# Patient Record
Sex: Female | Born: 1975
Health system: Southern US, Community
[De-identification: ages and names within clinical notes are randomized; demographics above are authoritative.]

## PROBLEM LIST (undated history)

## (undated) DIAGNOSIS — N926 Irregular menstruation, unspecified: Secondary | ICD-10-CM

## (undated) DIAGNOSIS — E669 Obesity, unspecified: Secondary | ICD-10-CM

## (undated) DIAGNOSIS — R87612 Low grade squamous intraepithelial lesion on cytologic smear of cervix (LGSIL): Secondary | ICD-10-CM

## (undated) DIAGNOSIS — R87810 Cervical high risk human papillomavirus (HPV) DNA test positive: Secondary | ICD-10-CM

## (undated) DIAGNOSIS — A6000 Herpesviral infection of urogenital system, unspecified: Secondary | ICD-10-CM

## (undated) HISTORY — PX: TONSILLECTOMY: SUR1361

## (undated) HISTORY — DX: Herpesviral infection of urogenital system, unspecified: A60.00

## (undated) HISTORY — DX: Low grade squamous intraepithelial lesion on cytologic smear of cervix (LGSIL): R87.612

## (undated) HISTORY — DX: Cervical high risk human papillomavirus (HPV) DNA test positive: R87.810

## (undated) HISTORY — DX: Irregular menstruation, unspecified: N92.6

## (undated) HISTORY — DX: Obesity, unspecified: E66.9

---

## 1999-03-29 ENCOUNTER — Other Ambulatory Visit: Admission: RE | Admit: 1999-03-29 | Discharge: 1999-03-29 | Payer: Self-pay | Admitting: Family Medicine

## 1999-08-27 ENCOUNTER — Other Ambulatory Visit: Admission: RE | Admit: 1999-08-27 | Discharge: 1999-08-27 | Payer: Self-pay | Admitting: *Deleted

## 1999-08-27 ENCOUNTER — Encounter (INDEPENDENT_AMBULATORY_CARE_PROVIDER_SITE_OTHER): Payer: Self-pay

## 2002-01-03 ENCOUNTER — Ambulatory Visit (HOSPITAL_COMMUNITY): Admission: RE | Admit: 2002-01-03 | Discharge: 2002-01-03 | Payer: Self-pay | Admitting: Neurology

## 2002-02-12 ENCOUNTER — Ambulatory Visit (HOSPITAL_COMMUNITY): Admission: RE | Admit: 2002-02-12 | Discharge: 2002-02-12 | Payer: Self-pay | Admitting: Neurology

## 2002-11-19 ENCOUNTER — Inpatient Hospital Stay (HOSPITAL_COMMUNITY): Admission: AD | Admit: 2002-11-19 | Discharge: 2002-12-07 | Payer: Self-pay | Admitting: Obstetrics and Gynecology

## 2002-12-05 ENCOUNTER — Encounter (INDEPENDENT_AMBULATORY_CARE_PROVIDER_SITE_OTHER): Payer: Self-pay | Admitting: Specialist

## 2002-12-08 ENCOUNTER — Encounter: Admission: RE | Admit: 2002-12-08 | Discharge: 2003-01-07 | Payer: Self-pay | Admitting: Obstetrics and Gynecology

## 2003-02-07 ENCOUNTER — Encounter: Admission: RE | Admit: 2003-02-07 | Discharge: 2003-03-09 | Payer: Self-pay | Admitting: Obstetrics and Gynecology

## 2003-03-10 ENCOUNTER — Encounter: Admission: RE | Admit: 2003-03-10 | Discharge: 2003-04-09 | Payer: Self-pay | Admitting: Obstetrics and Gynecology

## 2003-05-08 ENCOUNTER — Encounter: Admission: RE | Admit: 2003-05-08 | Discharge: 2003-06-07 | Payer: Self-pay | Admitting: Obstetrics and Gynecology

## 2004-08-27 IMAGING — US US OB TRANSVAGINAL MODIFY
1 series · 13 of 28 positions shown · non-contrast
Comparison: none

CLINICAL DATA: 33 week 3 day assigned gestational age.  Premature rupture of membranes.  Follow-up amniotic fluid volume and evaluate fetal growth.

[Series 1: unknown · 0.21mm/px · 13 of 42 slices shown]
[im 2/42]
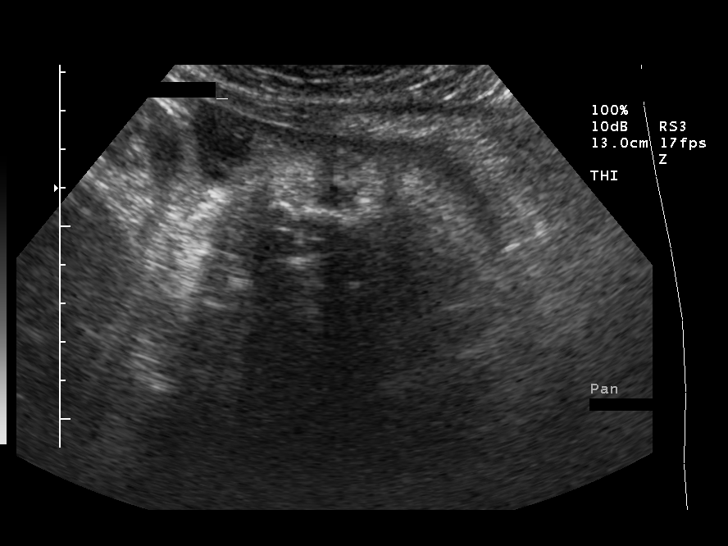
[im 5/42]
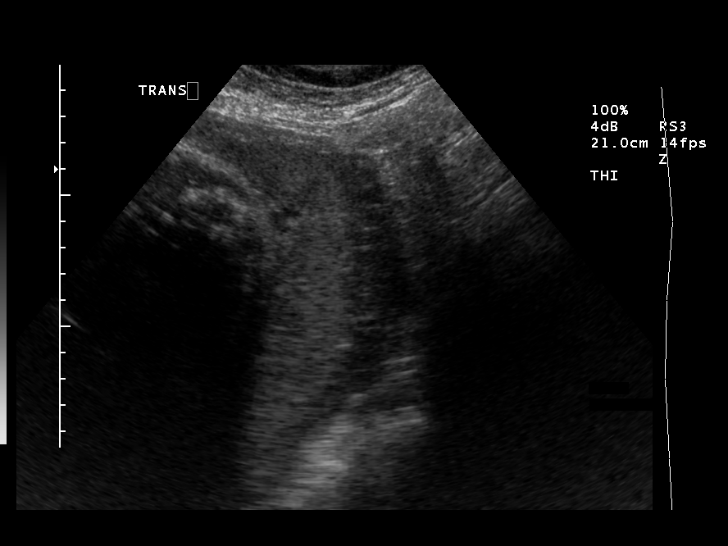
[im 8/42]
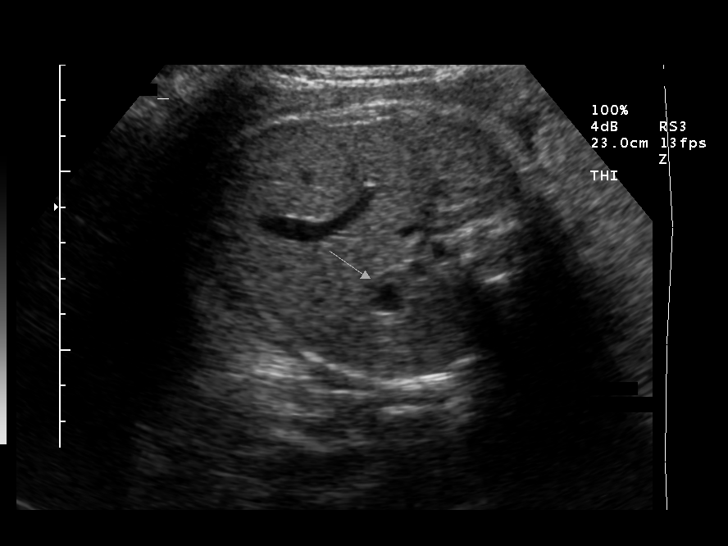
[im 11/42]
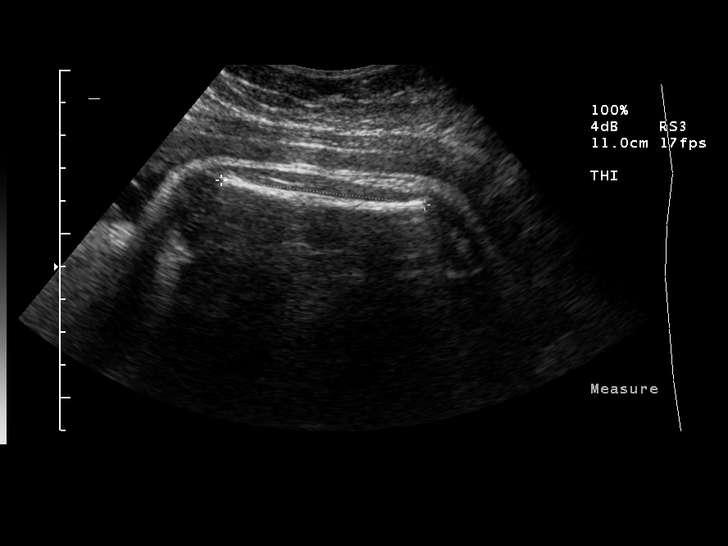
[im 14/42]
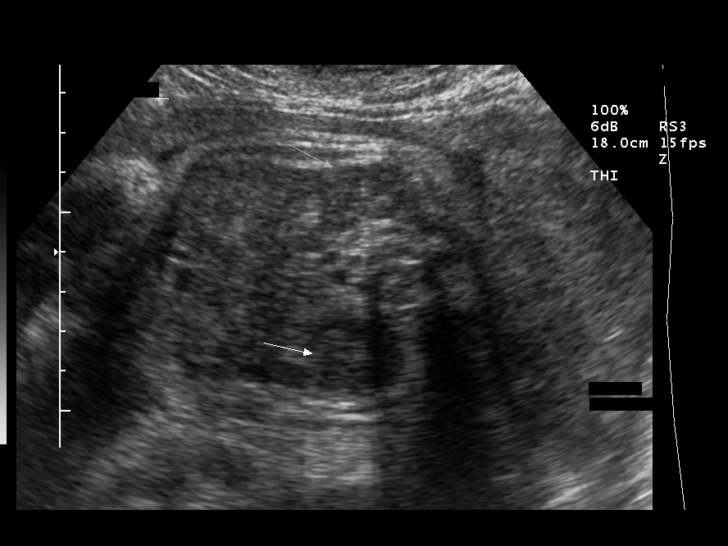
[im 17/42]
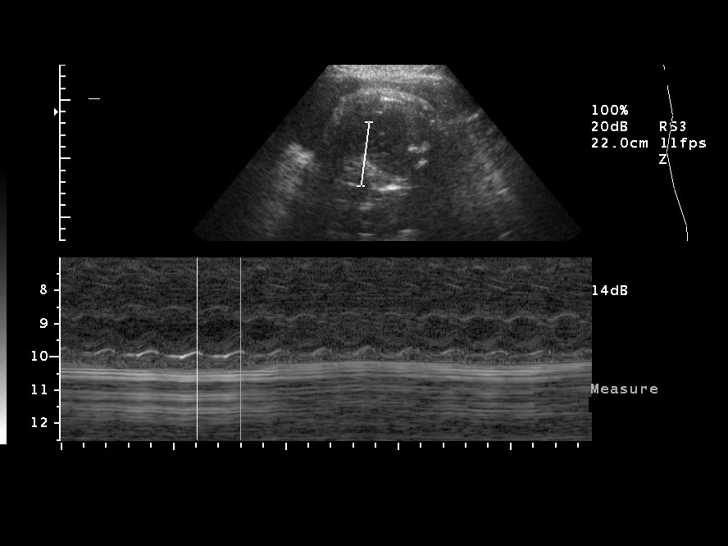
[im 22/42]
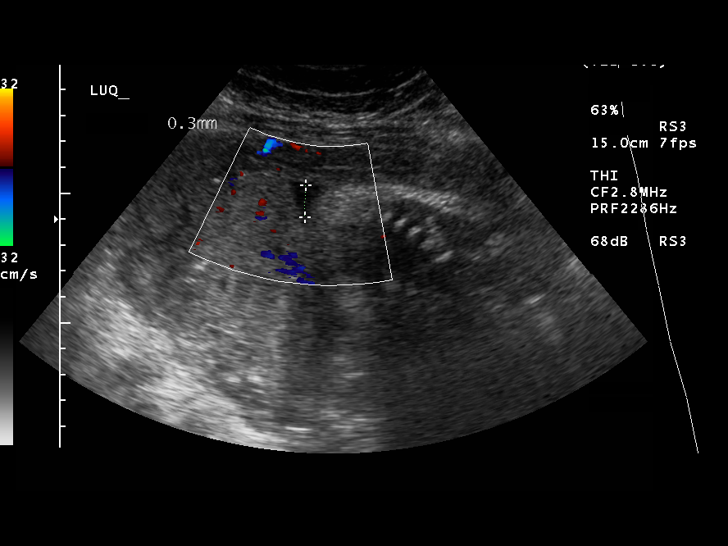
[im 25/42]
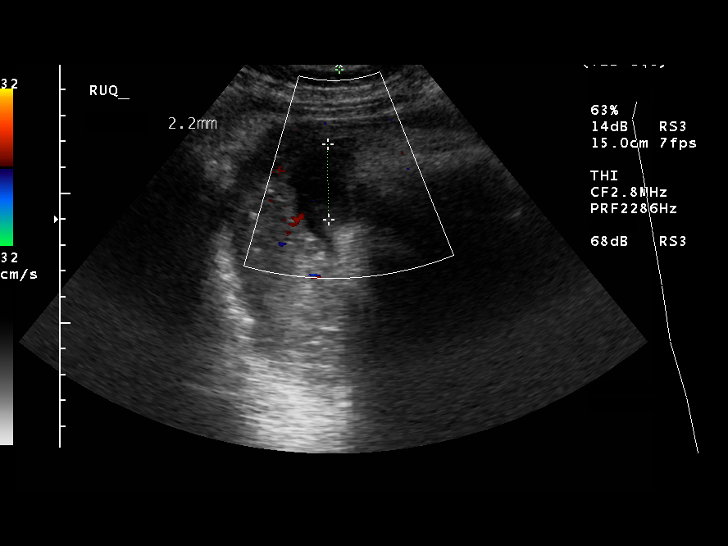
[im 28/42]
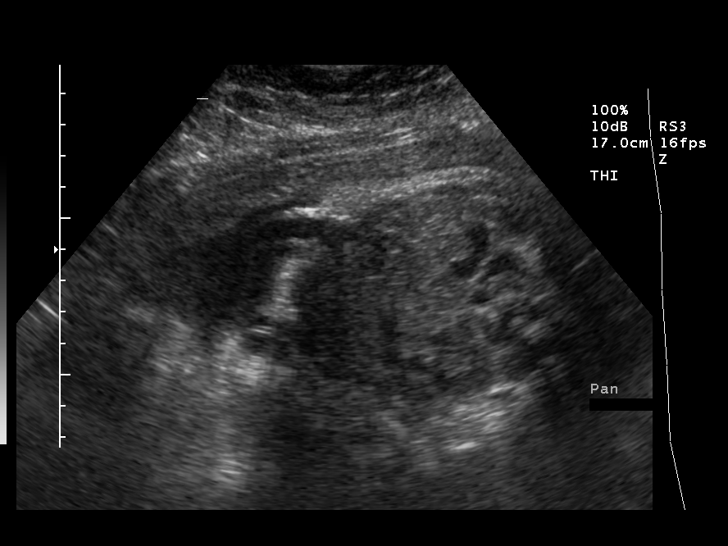
[im 31/42]
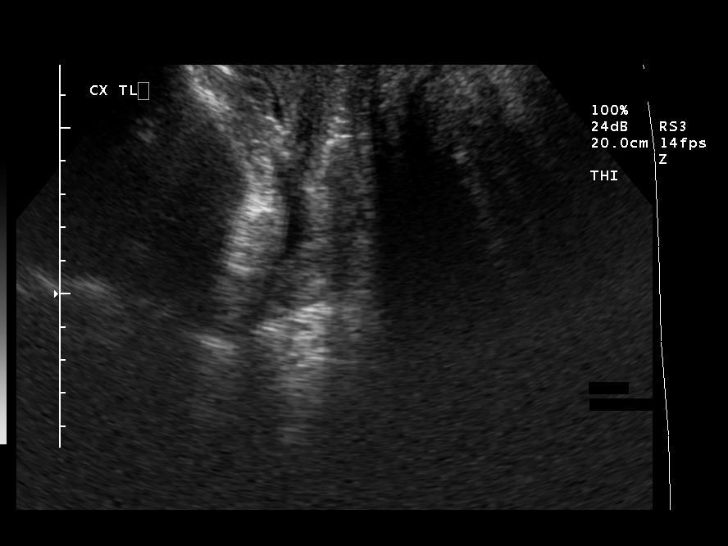
[im 34/42]
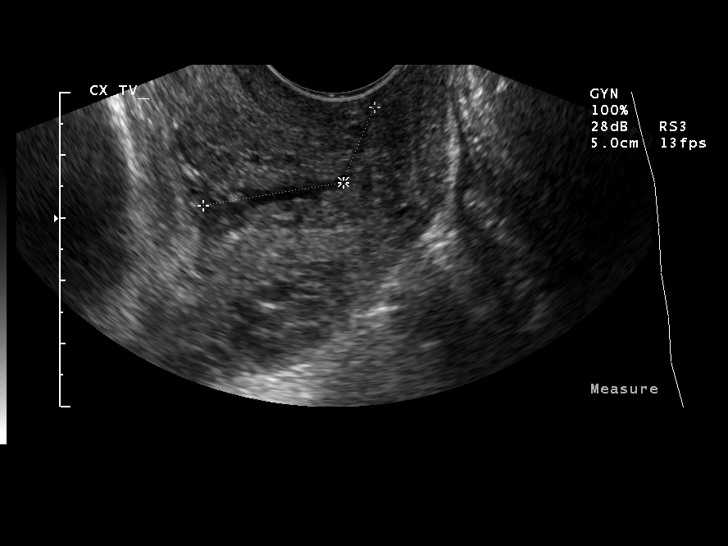
[im 37/42]
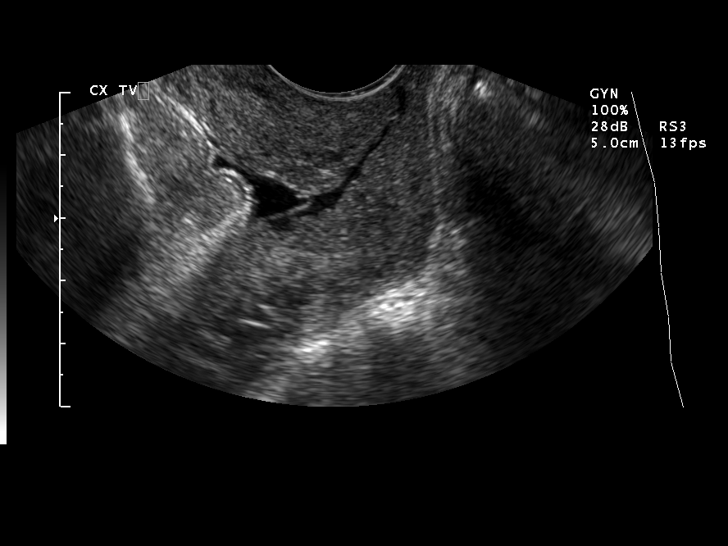
[im 40/42]
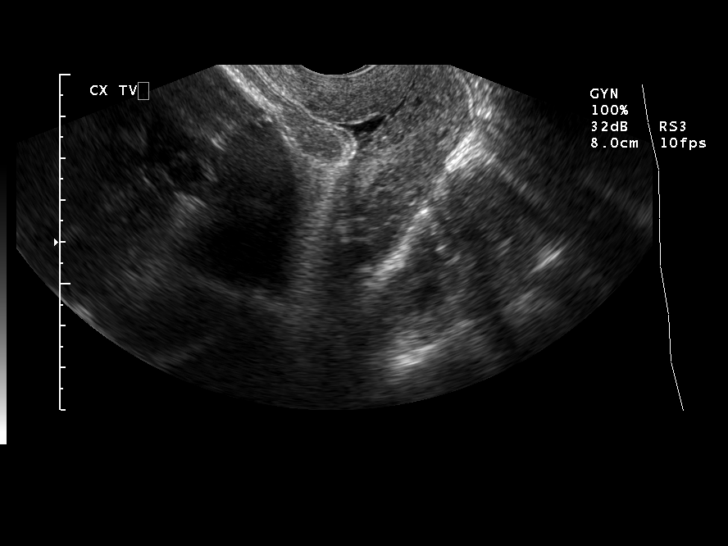

[13 of 28 positions shown; findings below may reference images not displayed]

OBSTETRICAL ULTRASOUND RE-EVALUATION WITH TRANSVAGINAL

NUMBER OF FETUSES:  1
HEART RATE:  155
MOVEMENT:  Yes
BREATHING:  Yes
PRESENTATION:  Cephalic
PLACENTAL LOCATION:  Left lateral
GRADE:  I
PREVIA:  No
AMNIOTIC FLUID (subjective): Decreased 
AMNIOTIC FLUID (objective): 5.8 cm AFI (5th - 95th%ile =  8.3?24.5 cm for 33w)

FETAL BIOMETRY
BPD:  8.2 cm   32 w 6 d
HC:  29.8  cm  33 w 0 d
AC:  30.3 cm  34 w 2 d
FL:  6.3 cm  32 w 3 d
MEAN GA:  33 w 1 d
ASSIGNED GA:  33 w 3 d (LMP)
EFW:  2577 g (H)  75th ? 90th%ile ( 6639 ? 3935 g) For 33 weeks

FETAL ANATOMY
LATERAL VENTRICLES:    Visualized 
THALAMI/CSP:  Previously seen 
POSTERIOR FOSSA:  Not visualized 
NUCHAL REGION:  N/A
SPINE:  Previously seen 
4 CHAMBER HEART ON LEFT:  Visualized 
STOMACH ON LEFT:  Visualized 
3 VESSEL CORD:  Visualized 
CORD INSERTION SITE:  Visualized 
KIDNEYS:  Visualized 
BLADDER:  Visualized 
EXTREMITIES:  Not visualized 

MATERNAL FINDINGS
CERVIX:  2.1 cm Transvaginal; dynamic funneling of the internal os is noted.
IMPRESSION: Assigned gestational age is currently 33 weeks 3 days.  Appropriate fetal growth, with EFW slightly above 75th percentile.  
Decreased amniotic fluid volume, with AFI of 5.8 cm.  This is consistent with patient?s history of ruptured membranes.
Shortened cervix measuring 2.1 cm, with dynamic funneling of the internal os noted.

## 2006-10-25 ENCOUNTER — Emergency Department (HOSPITAL_COMMUNITY): Admission: EM | Admit: 2006-10-25 | Discharge: 2006-10-26 | Payer: Self-pay | Admitting: Emergency Medicine

## 2007-04-06 ENCOUNTER — Emergency Department (HOSPITAL_COMMUNITY): Admission: EM | Admit: 2007-04-06 | Discharge: 2007-04-07 | Payer: Self-pay | Admitting: Emergency Medicine

## 2008-12-20 ENCOUNTER — Emergency Department (HOSPITAL_COMMUNITY): Admission: EM | Admit: 2008-12-20 | Discharge: 2008-12-20 | Payer: Self-pay | Admitting: Family Medicine

## 2010-01-14 HISTORY — PX: COLPOSCOPY: SHX161

## 2010-06-01 NOTE — H&P (Signed)
NAMETANYSHA, QUANT NO.:  [id number]   MEDICAL RECORD NO.:  [id number]                   PATIENT TYPE:  INP   LOCATION:  9157                                 FACILITY:  [facility]   PHYSICIAN:  Janine Limbo, M.D.            DATE OF BIRTH:  08/21/1975   DATE OF ADMISSION:  11/19/2002  DATE OF DISCHARGE:                                HISTORY & PHYSICAL   Ms. Mruk is a 35 year old married, black female, gravida 3, para 0-0-2-  0, at 31-2/7 weeks who presents to the office with leaking clear fluid all  day.  She denies bleeding.  She denies uterine contractions.  She reports  positive fetal movement.  Her pregnancy has been followed by the [facility]  Certified [facility] Midwife [facility] and has been remarkable  for:  1.  Two TABs.  2.  History of hypertension.  3.  History of occasional  migraines.  4.  Increased body mass index.   PRENATAL LABS:  Were collected on 06/25/2002.  Hemoglobin 11.8, hematocrit  36.2, platelets 350,000.  Blood type A positive.  Antibody negative.  Sickle  cell trait negative.  RPR nonreactive.  Rubella immune.  Hepatitis B surface  antigen negative.  A Pap smear, from July 2003, was within normal limits.  Her one hour Glucola was 67 and her RPR at that time was nonreactive.  Her  Pap smear from, August 18, 2002, was within normal limits.   HISTORY OF PRESENT PREGNANCY:  She presented for care at [facility],  on July 25, 2002, at 10-weeks gestation.  Pregnancy ultrasonography at 18-  weeks gestation shows growth consistent with previous dating.  Pregnancy  ultrasonography at 22-weeks gestation was able to visualize the complete  anatomy.  The rest of her prenatal care has been unremarkable.   OBSTETRICAL HISTORY:  She is gravida 3, para 0-0-2-0.  In 1995 and in 1998,  at about six weeks gestation, she had TABs with no complications.   MEDICAL HISTORY:  1. She has an allergy to AMOXICILLIN that results  in hives.  2. She has used Depo-Provera in the past as well as Ortho Tri-Cyclen for     contraception.  3. She has had on yeast infection.  4. She reports having had the usual childhood illnesses.  5. She was diagnosed with hypertension in 2002.  She was treated with     medications for six months and then taken off all medications.  She has     several family members with hypertension.  6. She was diagnosed in 2000 with migraines by Dr. Adonis Housekeeper and they are     only occasional now.   SURGICAL HISTORY:  1. Remarkable for tonsillectomy in 2001.  2. Lumbar punctures in 2004 for a visual disturbances.   FAMILY MEDICAL HISTORY:  Multiple family members with hypertension.  Mother  with varicosities.  Multiple family members with diabetes.  GENETIC HISTORY:  Unremarkable.   SOCIAL HISTORY:  The patient is married to the father of the baby, whose  name is Rayna Sexton.  He is involved and supportive.  They deny any alcohol,  tobacco or illicit drug use with the pregnancy.   OBJECTIVE DATA:  VITAL SIGNS:  Stable.  She is afebrile.  HEENT:  Grossly within normal limits.  CHEST:  Clear to auscultation.  HEART:  Regular rate and rhythm.  ABDOMEN:  Gravid in contour with fundal height extending approximately 31-cm  above the pubic symphysis.  Fetal heart rate is found by doppler to be in  the 150s.  There are no uterine contractions, per patient report.  PELVIC:  Sterile speculum exam shows questionable pooling, positive  nitrazine and positive ferning.  Digital cervical exam was not performed.  EXTREMITIES:  Within normal limits.   ASSESSMENT:  1. Intrauterine pregnancy at 31-2/7 weeks.  2. Preterm premature rupture of membranes.   PLAN:  Evaluate at [facility] and with a possible need to transfer the  patient to an alternate location that has a NICU available.     Cam Hai, C.N.M.                     Janine Limbo, M.D.    KS/MEDQ  D:  11/19/2002  T:  11/19/2002  Job:   [id number]

## 2010-06-01 NOTE — Discharge Summary (Signed)
NAMEWRENLEY, Carmen Sosa                        ACCOUNT NO.:  [id number]   MEDICAL RECORD NO.:  [id number]                   PATIENT TYPE:  INP   LOCATION:  9143                                 FACILITY:  [facility]   PHYSICIAN:  Osborn Coho, M.D.                DATE OF BIRTH:  15-Mar-1975   DATE OF ADMISSION:  11/19/2002  DATE OF DISCHARGE:  12/07/2002                                 DISCHARGE SUMMARY   ADMISSION DIAGNOSES:  1. Intrauterine pregnancy at [redacted] weeks gestation.  2. Spontaneous premature rupture of the membranes.   DISCHARGE DIAGNOSES:  1. Intrauterine pregnancy at [redacted] weeks gestation.  2. Spontaneous premature rupture of the membranes.  3. Status post normal spontaneous vaginal delivery of a viable female infant.  4. Breast-feeding.  5. Infant in the NICU.   PROCEDURE:  Normal spontaneous vaginal delivery of a viable female infant on  December 05, 2002 whose name is Rayna Sexton.  Attended in delivery by Osborn Coho, M.D.  Apgars were 8 and 9 and the baby weighed 5 pounds 6 ounces.   HOSPITAL COURSE:  Carmen Sosa is a 35 year old married black female  gravida 3, para 0-0-2-0 at [redacted] weeks gestation admitted with premature  rupture of the membranes with clear fluid.  She was not contracting actively  on admission and was admitted primarily for observation and received  betamethasone series upon admission.  Her group B Strep, gonorrhea, and  Chlamydia cultures were all negative on admission.  She was observed or  followed expectantly over the next several days and did not start laboring  until the evening of November 20 when she started contracting every three to  five minutes.  She was fully dilated about 4 a.m. on December 05, 2002 and  started pushing and delivered a viable female infant named Rayna Sexton at 4:37 a.m.  who weighed 5 pounds 6 ounces and had Apgars of 8 and 9 attended in delivery  by Osborn Coho, M.D.  Please see operative note for details.  She was  presumptively  diagnosed with chorioamnionitis prior to delivery and started  on clindamycin for that diagnosis.  The infant is doing well in the NICU,  though currently on IV antibiotics and is breathing room air and is deemed  stable.  The patient is ambulating, voiding, and eating without difficulty.  Her vital signs are stable and she has been afebrile throughout her hospital  stay.  She plans to breast-feed and is currently pumping without difficulty.  She is not decided regarding contraception at this time.  She is deemed  ready for discharge today.   DISCHARGE INSTRUCTIONS:  As per the [facility] handout.   DISCHARGE MEDICATIONS:  1. Motrin 600 mg p.o. q.6h. p.r.n. for pain.  2. Prenatal vitamins daily.   DISCHARGE LABORATORIES:  Hemoglobin 10.8, WBC count 12.1, platelets 303,000.   DISCHARGE FOLLOWUP:  Six weeks at [facility] or p.r.n.  Concha Pyo. [place], C.N.M.              Osborn Coho, M.D.    SJD/MEDQ  D:  12/07/2002  T:  12/07/2002  Job:  [id number]

## 2010-10-08 LAB — DIFFERENTIAL
Basophils Relative: 1
Eosinophils Relative: 1
Lymphocytes Relative: 39
Neutro Abs: 4.8
Neutrophils Relative %: 53

## 2010-10-08 LAB — POCT I-STAT, CHEM 8
BUN: 11
Calcium, Ion: 1.2
Chloride: 109
Creatinine, Ser: 1
Glucose, Bld: 118 — ABNORMAL HIGH
TCO2: 21

## 2010-10-08 LAB — CBC
HCT: 37.8
MCV: 86.8
WBC: 9.1

## 2010-10-08 LAB — POCT CARDIAC MARKERS: Myoglobin, poc: 33.3

## 2010-10-25 LAB — DIFFERENTIAL
Basophils Absolute: 0.1
Basophils Relative: 1
Eosinophils Absolute: 0.1
Eosinophils Relative: 1
Lymphs Abs: 3.8 — ABNORMAL HIGH
Monocytes Absolute: 1.1 — ABNORMAL HIGH
Monocytes Relative: 10
Neutrophils Relative %: 53

## 2010-10-25 LAB — POCT I-STAT CREATININE
Creatinine, Ser: 0.8
Operator id: 133351

## 2010-10-25 LAB — POCT CARDIAC MARKERS
CKMB, poc: <1 — ABNORMAL LOW
CKMB, poc: <1 — ABNORMAL LOW
Myoglobin, poc: 60.2
Operator id: 133351
Operator id: 133351
Troponin i, poc: <0.05

## 2010-10-25 LAB — CBC
Platelets: 397
RDW: 14.4 — ABNORMAL HIGH
WBC: 10.7 — ABNORMAL HIGH

## 2010-10-25 LAB — I-STAT 8, (EC8 V) (CONVERTED LAB)
BUN: 9
Chloride: 105

## 2010-10-25 LAB — D-DIMER, QUANTITATIVE: D-Dimer, Quant: 0.33

## 2014-01-28 ENCOUNTER — Encounter (HOSPITAL_COMMUNITY): Payer: Self-pay | Admitting: Emergency Medicine

## 2014-01-28 ENCOUNTER — Emergency Department (HOSPITAL_COMMUNITY): Payer: 59

## 2014-01-28 ENCOUNTER — Emergency Department (HOSPITAL_COMMUNITY)
Admission: EM | Admit: 2014-01-28 | Discharge: 2014-01-28 | Disposition: A | Discharge status: Other Acute Inpatient Hospital | Payer: 59 | Source: Home / Self Care | Attending: Emergency Medicine | Admitting: Emergency Medicine

## 2014-01-28 ENCOUNTER — Other Ambulatory Visit: Payer: Self-pay

## 2014-01-28 ENCOUNTER — Emergency Department (HOSPITAL_COMMUNITY)
Admission: EM | Admit: 2014-01-28 | Discharge: 2014-01-29 | Disposition: A | Discharge status: Home / Self Care | Payer: 59 | Attending: Emergency Medicine | Admitting: Emergency Medicine

## 2014-01-28 ENCOUNTER — Encounter (HOSPITAL_COMMUNITY): Payer: Self-pay

## 2014-01-28 DIAGNOSIS — Z880 Allergy status to penicillin: Secondary | ICD-10-CM | POA: Insufficient documentation

## 2014-01-28 DIAGNOSIS — R079 Chest pain, unspecified: Secondary | ICD-10-CM

## 2014-01-28 DIAGNOSIS — I10 Essential (primary) hypertension: Secondary | ICD-10-CM | POA: Insufficient documentation

## 2014-01-28 DIAGNOSIS — R0789 Other chest pain: Secondary | ICD-10-CM | POA: Diagnosis not present

## 2014-01-28 DIAGNOSIS — Z79899 Other long term (current) drug therapy: Secondary | ICD-10-CM | POA: Insufficient documentation

## 2014-01-28 LAB — I-STAT CHEM 8, ED
BUN: 6 mg/dL (ref 6–23)
CALCIUM ION: 1.19 mmol/L (ref 1.12–1.23)
CHLORIDE: 103 meq/L (ref 96–112)
CREATININE: 0.6 mg/dL (ref 0.50–1.10)
Glucose, Bld: 95 mg/dL (ref 70–99)
HCT: 40 % (ref 36.0–46.0)
HEMOGLOBIN: 13.6 g/dL (ref 12.0–15.0)
Potassium: 4.1 mmol/L (ref 3.5–5.1)
Sodium: 137 mmol/L (ref 135–145)
TCO2: 21 mmol/L (ref 0–100)

## 2014-01-28 LAB — I-STAT TROPONIN, ED: Troponin i, poc: 0 ng/mL (ref 0.00–0.08)

## 2014-01-28 MED ORDER — NITROGLYCERIN 0.4 MG SL SUBL
SUBLINGUAL_TABLET | SUBLINGUAL | Status: AC
  Filled 2014-01-28: qty 1

## 2014-01-28 MED ORDER — IBUPROFEN 200 MG PO TABS
600.0000 mg | ORAL_TABLET | Freq: Once | ORAL | Status: AC
  Administered 2014-01-28: 600 mg via ORAL
  Filled 2014-01-28 (×2): qty 1

## 2014-01-28 MED ORDER — ETODOLAC 400 MG PO TABS: 400.0000 mg | ORAL_TABLET | Freq: Two times a day (BID) | ORAL | Status: AC

## 2014-01-28 MED ORDER — SODIUM CHLORIDE 0.9 % IV SOLN
INTRAVENOUS | Status: DC
  Administered 2014-01-28: 20:00:00 via INTRAVENOUS

## 2014-01-28 MED ORDER — ASPIRIN 81 MG PO CHEW
324.0000 mg | CHEWABLE_TABLET | Freq: Once | ORAL | Status: AC
  Administered 2014-01-28: 324 mg via ORAL

## 2014-01-28 MED ORDER — NITROGLYCERIN 0.4 MG SL SUBL
0.4000 mg | SUBLINGUAL_TABLET | SUBLINGUAL | Status: AC | PRN
  Administered 2014-01-28: 0.4 mg via SUBLINGUAL

## 2014-01-28 MED ORDER — ASPIRIN 81 MG PO CHEW
CHEWABLE_TABLET | ORAL | Status: AC
  Filled 2014-01-28: qty 4

## 2014-01-28 MED ORDER — IBUPROFEN 400 MG PO TABS: 400.0000 mg | ORAL_TABLET | Freq: Once | ORAL | Status: DC

## 2014-01-28 MED ORDER — HYDROCODONE-ACETAMINOPHEN 5-325 MG PO TABS
1.0000 | ORAL_TABLET | Freq: Once | ORAL | Status: AC
  Administered 2014-01-28: 1 via ORAL
  Filled 2014-01-28: qty 1

## 2014-01-28 MED ORDER — CYCLOBENZAPRINE HCL 10 MG PO TABS
10.0000 mg | ORAL_TABLET | Freq: Once | ORAL | Status: AC
  Administered 2014-01-28: 10 mg via ORAL
  Filled 2014-01-28: qty 1

## 2014-01-28 MED ORDER — CYCLOBENZAPRINE HCL 10 MG PO TABS
10.0000 mg | ORAL_TABLET | Freq: Once | ORAL | Status: DC
Start: 2014-01-28 — End: 2016-08-09

## 2014-01-28 MED ORDER — HYDROCODONE-ACETAMINOPHEN 5-325 MG PO TABS: 2.0000 | ORAL_TABLET | ORAL | Status: DC | PRN

## 2014-01-28 NOTE — ED Notes (Signed)
Ambulated to bathroom at this time.  Md notified of continued c/o pain.

## 2014-01-28 NOTE — ED Notes (Signed)
Per EMS: Patient from UC where pt c/o Chest pain that started Wednesday and has not resolved.  Pain has been constant since.  Pt has experienced intermittent pain to left jaw, and pain shoots down left arm to finger tips.  Pt rates pain 6/10.  Pt described the pain as a "tightness".  EMS EKG WDL, vitals stable.

## 2014-01-28 NOTE — ED Provider Notes (Signed)
CSN: [id number]     Arrival date & time 01/28/14  2051 History   First MD Initiated Contact with Patient 01/28/14 2107     Chief Complaint  Patient presents with  . Chest Pain     (Consider location/radiation/quality/duration/timing/severity/associated sxs/prior Treatment) Patient is a 39 y.o. female presenting with chest pain. The history is provided by the patient and a relative. No language interpreter was used.  Chest Pain Pain location:  L chest Pain quality: pressure   Pain radiates to:  Does not radiate Pain radiates to the back: no   Pain severity:  Moderate Onset quality:  Gradual Duration:  2 days Timing:  Constant Progression:  Unchanged Chronicity:  New Context: breathing, movement and raising an arm   Relieved by:  Nothing Worsened by:  Deep breathing, movement and certain positions (Raising left arm) Ineffective treatments:  None tried Associated symptoms: no abdominal pain, no altered mental status, no anxiety, no back pain, no cough, no dizziness, no fever, no lower extremity edema, no nausea, no numbness, no shortness of breath and no weakness   Risk factors: hypertension and obesity   Risk factors: no aortic disease, no birth control, no coronary artery disease, no high cholesterol and not female     Past Medical History  Diagnosis Date  . Hypertension    Past Surgical History  Procedure Laterality Date  . Tonsillectomy     History reviewed. No pertinent family history. History  Substance Use Topics  . Smoking status: Never Smoker   . Smokeless tobacco: Not on file  . Alcohol Use: Yes     Comment: occasioanlly   OB History    No data available     Review of Systems  Constitutional: Negative for fever.  Respiratory: Negative for cough and shortness of breath.   Cardiovascular: Positive for chest pain.  Gastrointestinal: Negative for nausea and abdominal pain.  Genitourinary: Negative for dysuria, urgency and frequency.  Musculoskeletal: Negative  for back pain.  Neurological: Negative for dizziness, weakness and numbness.  All other systems reviewed and are negative.     Allergies  Amoxicillin  Home Medications   Prior to Admission medications   Medication Sig Start Date End Date Taking? Authorizing Provider  cyclobenzaprine (FLEXERIL) 10 MG tablet Take 1 tablet (10 mg total) by mouth once. 01/28/14   Katheren Shams, MD  etodolac (LODINE) 400 MG tablet Take 1 tablet (400 mg total) by mouth 2 (two) times daily. 01/28/14 02/04/14  Katheren Shams, MD  hydrochlorothiazide (HYDRODIURIL) 25 MG tablet Take 25 mg by mouth daily.    Historical Provider, MD  HYDROcodone-acetaminophen (NORCO/VICODIN) 5-325 MG per tablet Take 2 tablets by mouth every 4 (four) hours as needed. 01/28/14   Katheren Shams, MD   BP 135/72 mmHg  Pulse 64  Temp(Src) 97.9 F (36.6 C) (Oral)  Resp 18  Ht 5\' 4"  (1.626 m)  Wt 240 lb (108.863 kg)  BMI 41.18 kg/m2  SpO2 100%  LMP 01/24/2014 Physical Exam  Constitutional: She is oriented to person, place, and time. She appears well-developed and well-nourished. No distress.  HENT:  Head: Normocephalic and atraumatic.  Eyes: Pupils are equal, round, and reactive to light.  Neck: Normal range of motion.  Cardiovascular: Normal rate, regular rhythm, normal heart sounds and intact distal pulses.   Pulmonary/Chest: Effort normal. No respiratory distress. She has no wheezes. She exhibits tenderness (left anterior chest). She exhibits no bony tenderness, no edema and no deformity.  Abdominal: Soft. Bowel sounds are normal. She  exhibits no distension. There is no tenderness. There is no rebound and no guarding.  Neurological: She is alert and oriented to person, place, and time. She has normal strength. No cranial nerve deficit or sensory deficit. She exhibits normal muscle tone. Coordination and gait normal.  Skin: Skin is warm and dry.  Nursing note and vitals reviewed.   ED Course  Procedures (including critical care  time) [place], ED  I-STAT CHEM 8, ED  [facility], ED    Imaging Review Dg Chest 2 View  01/28/2014   CLINICAL DATA:  Three day history of chest pain radiating into the left arm. Current history of hypertension.  EXAM: CHEST  2 VIEW  COMPARISON:  04/06/2007.  CTA chest 10/26/2006.  FINDINGS: Cardiomediastinal silhouette unremarkable, unchanged. Lungs clear. Bronchovascular markings normal. Pulmonary vascularity normal. No visible pleural effusions. No pneumothorax. Mild degenerative changes involving the thoracic spine. No significant interval change.  IMPRESSION: No acute cardiopulmonary disease.  Stable examination.   Electronically Signed   By: Evangeline Dakin M.D.   On: 01/28/2014 23:13     EKG Interpretation   Date/Time:  Friday January 28 2014 20:54:37 EST Ventricular Rate:  65 PR Interval:  213 QRS Duration: 103 QT Interval:  397 QTC Calculation: 413 R Axis:   8 Text Interpretation:  Sinus rhythm Prolonged PR interval No significant  change since last tracing Confirmed by Maryan Rued  MD, Loree Fee ([id number]) on  01/28/2014 8:57:39 PM      MDM   Final diagnoses:  Chest pain  Musculoskeletal chest pain    Patient is a 39 year old African-American female with pertinent past medical history of hypertension who comes to the emergency department today as a transfer from urgent care with concerns for chest pain. Physical exam as above. Patient is PERC negative. Her history is not consistent with PE as a result I do not feel that we need to pursue this further at this time. Initial workup included an i-STAT chem 8, i-STAT troponin, chest x-ray, and an EKG. EKG is detailed above. Chest x-ray was unremarkable with no consolidations making pneumonia unlikely. There is no evidence of pneumothorax. I-STAT Chem-8 was unremarkable. I-STAT troponin was negative. Patient has had constant pain for the past 2 days with no waxing or waning symptoms.  As result  a single troponin should be sufficient to rule out MI. Patient's pain is worse with breathing, movement, palpation, and moving her arm. This is consistent with musculoskeletal pain. She was treated with Motrin and Flexeril with minimal improvement in her pain. She was treated with a dose of Norco with significant improvement. Patient was felt to be stable for discharge at this time with outpatient follow-up with her primary care physician in a week. She was instructed to return to the emergency department with worsening pain, exertional pain, shortness of breath, or any other concerns. The patient expressed understanding. She was discharged in a good condition. Labs and imaging were reviewed by myself and considered in medical decision making. Imaging was interpreted by radiology. Care was discussed with my attending Dr. Maryan Rued.     Katheren Shams, MD 01/29/14 1829  Blanchie Dessert, MD 01/29/14 2350

## 2014-01-28 NOTE — ED Provider Notes (Signed)
Chief Complaint   Chest Pain   History of Present Illness    Carmen Sosa is a 39 year old female with hypertension who has had a three-day history of left pectoral chest pain radiating down the right arm. This feels like a tightness and is rated 7-8/10 in intensity. The pain is continuous and unrelated to activity, exertion, position, or meals. It is not pleuritic. She denies any radiation through the back or into his neck. She has had no shortness of breath, nausea, or diaphoresis. She denies fevers, chills, URI symptoms, coughing, or wheezing. She has had no palpitations, she does feel a little dizzy and lightheaded. No syncope or presyncope. She denies any abdominal pain, nausea, or vomiting. She has had no cardiac history no history of DVT, thrombophlebitis, or blood clots. She has had no prolonged car plane trips and no leg pain or swelling. She denies any history of diabetes, hypercholesterolemia, cigarette smoking, or family history of heart disease.  Review of Systems    Other than noted above, the patient denies any of the following symptoms. Systemic:  No fever or chills. Pulmonary:  No cough, wheezing, shortness of breath, sputum production, hemoptysis. Cardiac:  No palpitations, rapid heartbeat, dizziness, presyncope or syncope. GI:  No abdominal pain, heartburn, nausea, or vomiting. Ext:  No leg pain or swelling.  [place]    Past medical history, family history, social history, meds, and allergies were reviewed. She is allergic to amoxicillin. She takes hydrochlorothiazide for high blood pressure.  Physical Exam     Vital signs:  BP 135/91 mmHg  Pulse 74  Temp(Src) 98.3 F (36.8 C) (Oral)  Resp 18  SpO2 98% Gen:  Alert, oriented, in no distress, skin warm and dry. ENT:  Mucous membranes moist, pharynx clear. Neck:  Supple, no adenopathy or tenderness.  No JVD. Lungs:  Clear to auscultation, no wheezes, rales or rhonchi.  No respiratory distress. Heart:  Regular  rhythm.  No gallops, murmers, clicks or rubs. Chest:  Moderate chest wall tenderness in the left pectoral area. Abdomen:  Soft, nontender, no organomegaly or mass.  Bowel sounds normal.  No pulsatile abdominal mass or bruit. Ext:  No edema.  No calf tenderness and Homann's sign negative.  Pulses full and equal. Skin:  Warm and dry.  No rash.    EKG Results:  Date: @EDTODAY (<PARAMETER> error)@  Rate: 68  Rhythm: normal sinus rhythm  QRS Axis: normal--59  Intervals: normal--QTc interval 427 ms  ST/T Wave abnormalities: normal  Conduction Disutrbances:none  Narrative Interpretation: Normal sinus rhythm, normal EKG.  Old EKG Reviewed: none available    Course in Urgent [place]   The following medications were given:  Medications  0.9 %  sodium chloride infusion   aspirin chewable tablet 324 mg   nitroGLYCERIN (NITROSTAT) SL tablet 0.4 mg  Assessment     The encounter diagnosis was Chest pain, unspecified chest pain type.  Differential diagnosis is acute coronary syndrome, pulmonary embolism, ruptured aneurysm, pneumothorax, Boerhaave syndrome, pericarditis, musculoskeletal pain, or reflux esophagitis.   Plan     The patient was transferred to the ED via EMS in stable condition.  Medical Decision Making:  39 year old female with hypertension has a 3 day history of left pectoral chest pain radiating to left arm.  No dyspnea, nausea or diaphoresis.  No cardiac history.  EKG is normal, but history is concerning for acute coronary syndrome or PE.  Will give TNG and ASA and send via EMS.        Harden Mo, MD 01/28/14 [phone]

## 2014-01-28 NOTE — Discharge Instructions (Signed)
We have determined that your problem requires further evaluation in the emergency department.  We will take care of your transport there.  Once at the emergency department, you will be evaluated by a provider and they will order whatever treatment or tests they deem necessary.  We cannot guarantee that they will do any specific test or do any specific treatment.  ° °

## 2014-01-28 NOTE — ED Notes (Signed)
Patient transported to X-ray 

## 2014-01-28 NOTE — ED Notes (Addendum)
Patient c/o pain left chest since Wednesday . Pain radiates into left arm. Pain reportedly "8" on 0-10 scale. Using cider vinegar for pain. Nothing seems to make the pain better or worse.

## 2014-01-28 NOTE — Discharge Instructions (Signed)

## 2014-01-28 NOTE — ED Notes (Signed)
Patient returned from X-ray 

## 2016-07-15 ENCOUNTER — Ambulatory Visit: Payer: Self-pay | Admitting: Obstetrics and Gynecology

## 2016-08-09 ENCOUNTER — Ambulatory Visit (INDEPENDENT_AMBULATORY_CARE_PROVIDER_SITE_OTHER): Payer: 59 | Admitting: Obstetrics and Gynecology

## 2016-08-09 ENCOUNTER — Encounter: Payer: Self-pay | Admitting: Obstetrics and Gynecology

## 2016-08-09 DIAGNOSIS — Z01419 Encounter for gynecological examination (general) (routine) without abnormal findings: Secondary | ICD-10-CM | POA: Diagnosis not present

## 2016-08-09 DIAGNOSIS — Z1239 Encounter for other screening for malignant neoplasm of breast: Secondary | ICD-10-CM

## 2016-08-09 DIAGNOSIS — Z1231 Encounter for screening mammogram for malignant neoplasm of breast: Secondary | ICD-10-CM

## 2016-08-09 DIAGNOSIS — Z1211 Encounter for screening for malignant neoplasm of colon: Secondary | ICD-10-CM | POA: Diagnosis not present

## 2016-08-09 NOTE — Progress Notes (Signed)
Patient ID: Carmen Sosa, female   DOB: 25-Dec-1975, 41 y.o.   MRN: [id number]     Gynecology Annual Exam  PCP: Patient, No Pcp Per  Chief Complaint:  Chief Complaint  Patient presents with  . Gynecologic Exam    History of Present Illness:Patient is a 41 y.o. G2P0011 presents for annual exam. The patient has no complaints today.   LMP: Patient's last menstrual period was 07/15/2016. Average Interval: regular, 28 days Duration of flow: 5 days Heavy Menses: no Clots: no Intermenstrual Bleeding: no Postcoital Bleeding: no Dysmenorrhea: no   The patient is sexually active. She denies dyspareunia.  The patient does perform self breast exams.  Not using anything for contraception (trying to conceive).  There is no notable family history of breast or ovarian cancer in her family.  The patient wears seatbelts: yes.   The patient has regular exercise: not asked.    The patient denies current symptoms of depression.     Review of Systems: Review of Systems  Constitutional: Negative for chills and fever.  HENT: Negative for congestion.   Respiratory: Negative for cough and shortness of breath.   Cardiovascular: Negative for chest pain and palpitations.  Gastrointestinal: Negative for abdominal pain, constipation, diarrhea, heartburn, nausea and vomiting.  Genitourinary: Negative for dysuria, frequency and urgency.  Skin: Negative for itching and rash.  Neurological: Negative for dizziness and headaches.  Endo/Heme/Allergies: Negative for polydipsia.  Psychiatric/Behavioral: Negative for depression.    Past Medical History:  Past Medical History:  Diagnosis Date  . Hypertension     Past Surgical History:  Past Surgical History:  Procedure Laterality Date  . TONSILLECTOMY      Gynecologic History:  Patient's last menstrual period was 07/15/2016. Last Pap: Results were: 06/26/15 NIL and HR HPV negative  Last mammogram: none on record Obstetric History:  G2P0011  Family History:  History reviewed. No pertinent family history.  Social History:  Social History   Social History  . Marital status: Married    Spouse name: N/A  . Number of children: N/A  . Years of education: N/A   Occupational History  . Not on file.   Social History Main Topics  . Smoking status: Never Smoker  . Smokeless tobacco: Never Used  . Alcohol use Yes     Comment: occasioanlly  . Drug use: No  . Sexual activity: Yes    Birth control/ protection: None   Other Topics Concern  . Not on file   Social History Narrative  . No narrative on file    Allergies:  Allergies  Allergen Reactions  . Amoxicillin Hives    Medications: Prior to Admission medications   Medication Sig Start Date End Date Taking? Authorizing Provider  valACYclovir (VALTREX) 500 MG tablet  07/03/16   [provider]    Physical Exam Vitals: Blood pressure 132/86, pulse 76, height 5\' 4"  (1.626 m), weight 252 lb (114.3 kg), last menstrual period 07/15/2016.  General: NAD HEENT: normocephalic, anicteric Thyroid: no enlargement, no palpable nodules Pulmonary: No increased work of breathing, CTAB Cardiovascular: RRR, distal pulses 2+ Breast: Breast symmetrical, no tenderness, no palpable nodules or masses, no skin or nipple retraction present, no nipple discharge.  No axillary or supraclavicular lymphadenopathy. Abdomen: NABS, soft, non-tender, non-distended.  Umbilicus without lesions.  No hepatomegaly, splenomegaly or masses palpable. No evidence of hernia  Genitourinary:  External: Normal external female genitalia.  Normal urethral meatus, normal  Bartholin's and Skene's glands.    Vagina: Normal vaginal mucosa, no  evidence of prolapse.    Cervix: Grossly normal in appearance, no bleeding  Uterus: Non-enlarged, mobile, normal contour.  No CMT  Adnexa: ovaries non-enlarged, no adnexal masses  Rectal: deferred  Lymphatic: no evidence of inguinal  lymphadenopathy Extremities: no edema, erythema, or tenderness Neurologic: Grossly intact Psychiatric: mood appropriate, affect full  Female chaperone present for pelvic and breast  portions of the physical exam    Assessment: 41 y.o. [id number] No problem-specific Assessment & Plan notes found for this encounter.   Plan: Problem List Items Addressed This Visit    None      1) Mammogram - recommend yearly screening mammogram.  Mammogram Was ordered today  2) Trying to conceive - discussed starting PNV, OPK's  3) ASCCP guidelines and rational discussed.  Patient opts for every 3 years screening interval  4) Osteoporosis  - per USPTF routine screening DEXA at age 61  5) Routine healthcare maintenance including cholesterol, diabetes screening discussed managed by PCP  6) Colonoscopy  Screening recommended starting at age 36 for average risk individuals, age 82 for individuals deemed at increased risk (including African Americans) and recommended to continue until age 36.  For patient age 59-85 individualized approach is recommended.  Gold standard screening is via colonoscopy, Cologuard screening is an acceptable alternative for patient unwilling or unable to undergo colonoscopy.  "Colorectal cancer screening for average?risk adults: 2018 guideline update from the American Cancer Society"CA: A Cancer Journal for Clinicians: Jun 12, 2016  - ordered  7) Follow up 1 year for routine annual

## 2016-08-09 NOTE — Patient Instructions (Signed)
Preventive Care 18-39 Years, Female Preventive care refers to lifestyle choices and visits with your health care provider that can promote health and wellness. What does preventive care include?  A yearly physical exam. This is also called an annual well check.  Dental exams once or twice a year.  Routine eye exams. Ask your health care provider how often you should have your eyes checked.  Personal lifestyle choices, including: ? Daily care of your teeth and gums. ? Regular physical activity. ? Eating a healthy diet. ? Avoiding tobacco and drug use. ? Limiting alcohol use. ? Practicing safe sex. ? Taking vitamin and mineral supplements as recommended by your health care provider. What happens during an annual well check? The services and screenings done by your health care provider during your annual well check will depend on your age, overall health, lifestyle risk factors, and family history of disease. Counseling Your health care provider may ask you questions about your:  Alcohol use.  Tobacco use.  Drug use.  Emotional well-being.  Home and relationship well-being.  Sexual activity.  Eating habits.  Work and work Statistician.  Method of birth control.  Menstrual cycle.  Pregnancy history.  Screening You may have the following tests or measurements:  Height, weight, and BMI.  Diabetes screening. This is done by checking your blood sugar (glucose) after you have not eaten for a while (fasting).  Blood pressure.  Lipid and cholesterol levels. These may be checked every 5 years starting at age 66.  Skin check.  Hepatitis C blood test.  Hepatitis B blood test.  Sexually transmitted disease (STD) testing.  BRCA-related cancer screening. This may be done if you have a family history of breast, ovarian, tubal, or peritoneal cancers.  Pelvic exam and Pap test. This may be done every 3 years starting at age 40. Starting at age 59, this may be done every 5  years if you have a Pap test in combination with an HPV test.  Discuss your test results, treatment options, and if necessary, the need for more tests with your health care provider. Vaccines Your health care provider may recommend certain vaccines, such as:  Influenza vaccine. This is recommended every year.  Tetanus, diphtheria, and acellular pertussis (Tdap, Td) vaccine. You may need a Td booster every 10 years.  Varicella vaccine. You may need this if you have not been vaccinated.  HPV vaccine. If you are 69 or younger, you may need three doses over 6 months.  Measles, mumps, and rubella (MMR) vaccine. You may need at least one dose of MMR. You may also need a second dose.  Pneumococcal 13-valent conjugate (PCV13) vaccine. You may need this if you have certain conditions and were not previously vaccinated.  Pneumococcal polysaccharide (PPSV23) vaccine. You may need one or two doses if you smoke cigarettes or if you have certain conditions.  Meningococcal vaccine. One dose is recommended if you are age 27-21 years and a first-year college student living in a residence hall, or if you have one of several medical conditions. You may also need additional booster doses.  Hepatitis A vaccine. You may need this if you have certain conditions or if you travel or work in places where you may be exposed to hepatitis A.  Hepatitis B vaccine. You may need this if you have certain conditions or if you travel or work in places where you may be exposed to hepatitis B.  Haemophilus influenzae type b (Hib) vaccine. You may need this if  you have certain risk factors.  Talk to your health care provider about which screenings and vaccines you need and how often you need them. This information is not intended to replace advice given to you by your health care provider. Make sure you discuss any questions you have with your health care provider. Document Released: 02/26/2001 Document Revised: 09/20/2015  Document Reviewed: 11/01/2014 Elsevier Interactive Patient Education  2017 [employer].

## 2016-08-26 ENCOUNTER — Telehealth: Payer: Self-pay

## 2016-08-26 NOTE — Telephone Encounter (Signed)
Called to scheduled screening colonoscopy per referral received.   Explained to patient that although the [facility] has changed their guidelines and recommended ages for screenings; [employer] has not.   Patient states not having any GI issues. She opts to wait until she's 50 or insurance will pay before scheduling.

## 2016-11-27 ENCOUNTER — Telehealth: Payer: Self-pay

## 2016-11-27 NOTE — Telephone Encounter (Signed)
Pt states that [facility] has sent a refill request for her and has not heard anything. Left msg for pt to verify what medication she is needing for [place] to send to mail order.

## 2017-02-03 ENCOUNTER — Encounter: Payer: Self-pay | Admitting: Obstetrics and Gynecology

## 2017-02-03 ENCOUNTER — Encounter: Payer: BLUE CROSS/BLUE SHIELD | Admitting: Obstetrics and Gynecology

## 2017-02-03 ENCOUNTER — Other Ambulatory Visit: Payer: Self-pay | Admitting: Obstetrics and Gynecology

## 2017-02-03 MED ORDER — VALACYCLOVIR HCL 500 MG PO TABS: 500.0000 mg | ORAL_TABLET | Freq: Every day | ORAL | 1 refills | Status: DC

## 2017-02-03 NOTE — Progress Notes (Signed)
Rx RF on daily valtrex till annual 7/19.

## 2017-02-03 NOTE — Progress Notes (Signed)
Pt needed Rx RF on med, and is current on exam. Didn't need to be seen. Rx Rf.   This encounter was created in error - please disregard.

## 2017-02-03 NOTE — Patient Instructions (Signed)
I value your feedback and entrusting us with your care. If you get a [facility] patient survey, I would appreciate you taking the time to let us know about your experience today. Thank you! 

## 2017-08-15 ENCOUNTER — Other Ambulatory Visit: Payer: Self-pay | Admitting: Obstetrics and Gynecology

## 2017-08-28 ENCOUNTER — Ambulatory Visit (INDEPENDENT_AMBULATORY_CARE_PROVIDER_SITE_OTHER): Payer: BLUE CROSS/BLUE SHIELD | Admitting: Obstetrics and Gynecology

## 2017-08-28 ENCOUNTER — Encounter: Payer: Self-pay | Admitting: Obstetrics and Gynecology

## 2017-08-28 VITALS — BP 138/84 | HR 70 | Ht 64.5 in | Wt 254.0 lb

## 2017-08-28 DIAGNOSIS — A6000 Herpesviral infection of urogenital system, unspecified: Secondary | ICD-10-CM | POA: Insufficient documentation

## 2017-08-28 DIAGNOSIS — Z1231 Encounter for screening mammogram for malignant neoplasm of breast: Secondary | ICD-10-CM

## 2017-08-28 DIAGNOSIS — Z1239 Encounter for other screening for malignant neoplasm of breast: Secondary | ICD-10-CM

## 2017-08-28 DIAGNOSIS — Z01419 Encounter for gynecological examination (general) (routine) without abnormal findings: Secondary | ICD-10-CM

## 2017-08-28 DIAGNOSIS — A6004 Herpesviral vulvovaginitis: Secondary | ICD-10-CM | POA: Diagnosis not present

## 2017-08-28 DIAGNOSIS — Z30011 Encounter for initial prescription of contraceptive pills: Secondary | ICD-10-CM

## 2017-08-28 DIAGNOSIS — Z01411 Encounter for gynecological examination (general) (routine) with abnormal findings: Secondary | ICD-10-CM | POA: Diagnosis not present

## 2017-08-28 MED ORDER — NORETHINDRONE ACET-ETHINYL EST 1-20 MG-MCG PO TABS: 1.0000 | ORAL_TABLET | Freq: Every day | ORAL | 0 refills | Status: DC

## 2017-08-28 MED ORDER — VALACYCLOVIR HCL 500 MG PO TABS: 500.0000 mg | ORAL_TABLET | Freq: Every day | ORAL | 0 refills | Status: DC

## 2017-08-28 MED ORDER — NORETHINDRONE ACET-ETHINYL EST 1-20 MG-MCG PO TABS: 1.0000 | ORAL_TABLET | Freq: Every day | ORAL | 3 refills | Status: DC

## 2017-08-28 NOTE — Progress Notes (Signed)
PCP:  Patient, No Pcp Per   Chief Complaint  Patient presents with  . Gynecologic Exam    pt states around july 19 had sharp pain mainly on left pelvic side "like labor pain" per pt     HPI:      [name] is a 42 y.o. G2P0011 who LMP was Patient's last menstrual period was 08/11/2017 (exact date)., presents today for her annual examination.  Her menses are regular every 28-30 days, lasting 5 days.  Dysmenorrhea none. She does not have intermenstrual bleeding. Had 3 days of labor-type LLQ pains last month that resolved spontaneously. No sx since.   Sex activity: single partner, contraception - none. Had wanted to conceive last yr but wants to restart OCPs. No hx of HTN/DVTs/seizures/tob. BP elevated today but pt stuck in traffic and was worried about being late for appt.   Last Pap: June 26, 2015  Results were: no abnormalities /neg HPV DNA  Hx of STDs: HPV on pap; HSV, takes valtrex prn. Needs [facility]  Last mammogram: never did last yr There is no FH of breast cancer. There is no FH of ovarian cancer. The patient does not do self-breast exams.  Tobacco use: The patient denies current or previous tobacco use. Alcohol use: social drinker No drug use.  Exercise: moderately active  She does get adequate calcium but not Vitamin D in her diet. Labs at work.   Past Medical History:  Diagnosis Date  . Cervical high risk human papillomavirus (HPV) DNA test positive 2012, 2013  . Genital herpes   . Irregular menses   . LGSIL on Pap smear of cervix 2012, 2013  . Obesity     Past Surgical History:  Procedure Laterality Date  . COLPOSCOPY  2012  . TONSILLECTOMY      Family History  Problem Relation Age of Onset  . Diabetes Father   . Breast cancer Neg Hx   . Ovarian cancer Neg Hx     Social History   Socioeconomic History  . Marital status: Married    Spouse name: Not on file  . Number of children: Not on file  . Years of education: Not on file  .  Highest education level: Not on file  Occupational History  . Not on file  Social Needs  . Financial resource strain: Not on file  . Food insecurity:    Worry: Not on file    Inability: Not on file  . Transportation needs:    Medical: Not on file    Non-medical: Not on file  Tobacco Use  . Smoking status: Never Smoker  . Smokeless tobacco: Never Used  Substance and Sexual Activity  . Alcohol use: Yes    Comment: occasioanlly  . Drug use: No  . Sexual activity: Yes    Birth control/protection: None  Lifestyle  . Physical activity:    Days per week: Not on file    Minutes per session: Not on file  . Stress: Not on file  Relationships  . Social connections:    Talks on phone: Not on file    Gets together: Not on file    Attends religious service: Not on file    Active member of club or organization: Not on file    Attends meetings of clubs or organizations: Not on file    Relationship status: Not on file  . Intimate partner violence:    Fear of current or ex partner: Not on  file    Emotionally abused: Not on file    Physically abused: Not on file    Forced sexual activity: Not on file  Other Topics Concern  . Not on file  Social History Narrative  . Not on file    Outpatient Medications Prior to Visit  Medication Sig Dispense Refill  . valACYclovir (VALTREX) 500 MG tablet TAKE 1 TABLET BY MOUTH  DAILY 90 tablet 0   No facility-administered medications prior to visit.     ROS:  Review of Systems  Constitutional: Negative for fatigue, fever and unexpected weight change.  Respiratory: Negative for cough, shortness of breath and wheezing.   Cardiovascular: Negative for chest pain, palpitations and leg swelling.  Gastrointestinal: Negative for blood in stool, constipation, diarrhea, nausea and vomiting.  Endocrine: Negative for cold intolerance, heat intolerance and polyuria.  Genitourinary: Negative for dyspareunia, dysuria, flank pain, frequency, genital sores,  hematuria, menstrual problem, pelvic pain, urgency, vaginal bleeding, vaginal discharge and vaginal pain.  Musculoskeletal: Negative for back pain, joint swelling and myalgias.  Skin: Negative for rash.  Neurological: Negative for dizziness, syncope, light-headedness, numbness and headaches.  Hematological: Negative for adenopathy.  Psychiatric/Behavioral: Negative for agitation, confusion, sleep disturbance and suicidal ideas. The patient is not nervous/anxious.    BREAST: No symptoms   Objective: BP (!) 140/100   Pulse 70   Ht 5' 4.5" (1.638 m)   Wt 254 lb (115.2 kg)   LMP 08/11/2017 (Exact Date)   BMI 42.93 kg/m    Physical Exam  Constitutional: She is oriented to person, place, and time. She appears well-developed and well-nourished.  Genitourinary: Vagina normal and uterus normal. There is no rash or tenderness on the right labia. There is no rash or tenderness on the left labia. No erythema or tenderness in the vagina. No vaginal discharge found. Right adnexum does not display mass and does not display tenderness. Left adnexum does not display mass and does not display tenderness. Cervix does not exhibit motion tenderness or polyp. Uterus is not enlarged or tender.  Neck: Normal range of motion. No thyromegaly present.  Cardiovascular: Normal rate, regular rhythm and normal heart sounds.  No murmur heard. Pulmonary/Chest: Effort normal and breath sounds normal. Right breast exhibits no mass, no nipple discharge, no skin change and no tenderness. Left breast exhibits no mass, no nipple discharge, no skin change and no tenderness.  Abdominal: Soft. There is no tenderness. There is no guarding.  Musculoskeletal: Normal range of motion.  Neurological: She is alert and oriented to person, place, and time. No cranial nerve deficit.  Psychiatric: She has a normal mood and affect. Her behavior is normal.  Vitals reviewed.   Assessment/Plan: Encounter for annual routine gynecological  examination  Screening for breast cancer - Pt to sched mammo - Plan: MM DIGITAL SCREENING BILATERAL  Herpes simplex vulvovaginitis - Rx RF valtrex to [facility], takes prn - Plan: valACYclovir (VALTREX) 500 MG tablet  Encounter for initial prescription of contraceptive pills - OCP start with next menses. Condoms. Rx eRxd to local pharm and [facility].  - Plan: norethindrone-ethinyl estradiol (MICROGESTIN) 1-20 MG-MCG tablet, DISCONTINUED: norethindrone-ethinyl estradiol (MICROGESTIN) 1-20 MG-MCG tablet  Meds ordered this encounter  Medications  . DISCONTD: norethindrone-ethinyl estradiol (MICROGESTIN) 1-20 MG-MCG tablet    Sig: Take 1 tablet by mouth daily.    Dispense:  84 tablet    Refill:  3    Order Specific Question:   Supervising Provider    Answer:   Gae Dry [id number]  .  valACYclovir (VALTREX) 500 MG tablet    Sig: Take 1 tablet (500 mg total) by mouth daily.    Dispense:  90 tablet    Refill:  0    Order Specific Question:   Supervising Provider    Answer:   Gae Dry [id number]  . norethindrone-ethinyl estradiol (MICROGESTIN) 1-20 MG-MCG tablet    Sig: Take 1 tablet by mouth daily.    Dispense:  28 tablet    Refill:  0    Order Specific Question:   Supervising Provider    Answer:   Gae Dry [[id number]]             GYN counsel breast self exam, mammography screening, use and side effects of OCP's, adequate intake of calcium and vitamin D, diet and exercise     F/U  Return in about 1 year (around 08/29/2018).  Alicia B. Copland, PA-C 08/28/2017 4:46 PM

## 2017-08-28 NOTE — Patient Instructions (Addendum)
I value your feedback and entrusting us with your care. If you get a [facility] patient survey, I would appreciate you taking the time to let us know about your experience today. Thank you!  [facility]: [phone]  [facility]: [phone]  

## 2017-09-24 ENCOUNTER — Other Ambulatory Visit: Payer: Self-pay | Admitting: Obstetrics and Gynecology

## 2017-09-24 DIAGNOSIS — Z30011 Encounter for initial prescription of contraceptive pills: Secondary | ICD-10-CM

## 2018-01-02 ENCOUNTER — Other Ambulatory Visit: Payer: Self-pay | Admitting: Obstetrics and Gynecology

## 2018-01-02 DIAGNOSIS — A6004 Herpesviral vulvovaginitis: Secondary | ICD-10-CM

## 2018-01-02 NOTE — Telephone Encounter (Signed)
Please advise 

## 2018-09-07 ENCOUNTER — Telehealth: Payer: Self-pay

## 2018-09-07 ENCOUNTER — Other Ambulatory Visit: Payer: Self-pay | Admitting: Obstetrics and Gynecology

## 2018-09-07 MED ORDER — FLUCONAZOLE 150 MG PO TABS: 150.0000 mg | ORAL_TABLET | Freq: Once | ORAL | 0 refills | Status: AC

## 2018-09-07 NOTE — Progress Notes (Signed)
Rx diflucan for yeast vag. Seen at urgent care a wk ago, Given diflucan. Sx improved initially but recurred. Treat again, has 09/23/18 f/u if sx persist.

## 2018-09-07 NOTE — Telephone Encounter (Signed)
Pt reports Urgent Care unwilling to refill meds w/o additional apt.

## 2018-09-07 NOTE — Telephone Encounter (Signed)
Pt was treated by Urgent Care for [facility] w/Diflucan. Seems like it was going to clear up, but after the 3rd day, it came back. It's lighter than what it was. Cb#[phone]

## 2018-09-07 NOTE — Telephone Encounter (Signed)
Spoke w/pt. Recommended that she contact the Urgent Care that treated her to see if they would in another Diflucan. If they are unable to retreat or this doesn't take care of the YI, advised she contact us for apt to schedule apt for eval.

## 2018-09-07 NOTE — Telephone Encounter (Addendum)
Relayed info to [facility]. [facility] will refill Diflucan and request pt to f/u if symptoms persist.

## 2018-09-09 ENCOUNTER — Encounter (HOSPITAL_COMMUNITY): Payer: Self-pay | Admitting: Emergency Medicine

## 2018-09-09 ENCOUNTER — Emergency Department (HOSPITAL_COMMUNITY): Payer: Managed Care, Other (non HMO)

## 2018-09-09 ENCOUNTER — Emergency Department (HOSPITAL_COMMUNITY)
Admission: EM | Admit: 2018-09-09 | Discharge: 2018-09-09 | Disposition: A | Discharge status: Home / Self Care | Payer: Managed Care, Other (non HMO) | Attending: Emergency Medicine | Admitting: Emergency Medicine

## 2018-09-09 ENCOUNTER — Other Ambulatory Visit: Payer: Self-pay

## 2018-09-09 DIAGNOSIS — Y929 Unspecified place or not applicable: Secondary | ICD-10-CM | POA: Insufficient documentation

## 2018-09-09 DIAGNOSIS — R51 Headache: Secondary | ICD-10-CM | POA: Insufficient documentation

## 2018-09-09 DIAGNOSIS — Y939 Activity, unspecified: Secondary | ICD-10-CM | POA: Insufficient documentation

## 2018-09-09 DIAGNOSIS — S39012A Strain of muscle, fascia and tendon of lower back, initial encounter: Secondary | ICD-10-CM | POA: Diagnosis not present

## 2018-09-09 DIAGNOSIS — M542 Cervicalgia: Secondary | ICD-10-CM | POA: Diagnosis present

## 2018-09-09 DIAGNOSIS — S161XXA Strain of muscle, fascia and tendon at neck level, initial encounter: Secondary | ICD-10-CM | POA: Diagnosis not present

## 2018-09-09 DIAGNOSIS — Y999 Unspecified external cause status: Secondary | ICD-10-CM | POA: Insufficient documentation

## 2018-09-09 LAB — CBC WITH DIFFERENTIAL/PLATELET
Abs Immature Granulocytes: 0.02 10*3/uL (ref 0.00–0.07)
Basophils Absolute: 0 10*3/uL (ref 0.0–0.1)
Basophils Relative: 1 %
Eosinophils Absolute: 0 10*3/uL (ref 0.0–0.5)
Eosinophils Relative: 0 %
HCT: 35.4 % — ABNORMAL LOW (ref 36.0–46.0)
Hemoglobin: 11.2 g/dL — ABNORMAL LOW (ref 12.0–15.0)
Immature Granulocytes: 0 %
Lymphocytes Relative: 21 %
Lymphs Abs: 1.7 10*3/uL (ref 0.7–4.0)
MCH: 26.6 pg (ref 26.0–34.0)
MCHC: 31.6 g/dL (ref 30.0–36.0)
MCV: 84.1 fL (ref 80.0–100.0)
Monocytes Absolute: 0.8 10*3/uL (ref 0.1–1.0)
Monocytes Relative: 10 %
Neutro Abs: 5.3 10*3/uL (ref 1.7–7.7)
Neutrophils Relative %: 68 %
Platelets: 437 10*3/uL — ABNORMAL HIGH (ref 150–400)
RBC: 4.21 MIL/uL (ref 3.87–5.11)
RDW: 15.9 % — ABNORMAL HIGH (ref 11.5–15.5)
WBC: 7.8 10*3/uL (ref 4.0–10.5)
nRBC: 0 % (ref 0.0–0.2)

## 2018-09-09 LAB — POC URINE PREG, ED: Preg Test, Ur: NEGATIVE

## 2018-09-09 LAB — BASIC METABOLIC PANEL
Anion gap: 10 (ref 5–15)
BUN: 6 mg/dL (ref 6–20)
CO2: 22 mmol/L (ref 22–32)
Calcium: 9.3 mg/dL (ref 8.9–10.3)
Chloride: 103 mmol/L (ref 98–111)
Creatinine, Ser: 0.77 mg/dL (ref 0.44–1.00)
GFR calc Af Amer: >60 mL/min (ref >60)
GFR calc non Af Amer: >60 mL/min (ref >60)
Glucose, Bld: 90 mg/dL (ref 70–99)
Potassium: 3.8 mmol/L (ref 3.5–5.1)
Sodium: 135 mmol/L (ref 135–145)

## 2018-09-09 MED ORDER — TRAMADOL HCL 50 MG PO TABS: 50.0000 mg | ORAL_TABLET | Freq: Four times a day (QID) | ORAL | 0 refills | Status: DC | PRN

## 2018-09-09 MED ORDER — IBUPROFEN 800 MG PO TABS: 800.0000 mg | ORAL_TABLET | Freq: Three times a day (TID) | ORAL | 0 refills | Status: AC | PRN

## 2018-09-09 MED ORDER — IOHEXOL 300 MG/ML  SOLN
100.0000 mL | Freq: Once | INTRAMUSCULAR | Status: AC | PRN
  Administered 2018-09-09: 100 mL via INTRAVENOUS

## 2018-09-09 MED ORDER — HYDROCODONE-ACETAMINOPHEN 5-325 MG PO TABS
1.0000 | ORAL_TABLET | Freq: Once | ORAL | Status: AC
  Administered 2018-09-09: 1 via ORAL
  Filled 2018-09-09: qty 1

## 2018-09-09 NOTE — ED Notes (Signed)
Patient verbalizes understanding of discharge instructions. Opportunity for questioning and answers were provided. Armband removed by staff, pt discharged from ED.  
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
## 2018-09-09 NOTE — Discharge Instructions (Signed)
Return here as needed.  Follow-up with your primary doctor for recheck.  Use ice and heat on the areas that are sore.
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
## 2018-09-14 ENCOUNTER — Other Ambulatory Visit: Payer: Self-pay | Admitting: Endocrinology

## 2018-09-14 DIAGNOSIS — Z1231 Encounter for screening mammogram for malignant neoplasm of breast: Secondary | ICD-10-CM

## 2018-09-23 ENCOUNTER — Ambulatory Visit (INDEPENDENT_AMBULATORY_CARE_PROVIDER_SITE_OTHER): Payer: Managed Care, Other (non HMO) | Admitting: Obstetrics and Gynecology

## 2018-09-23 ENCOUNTER — Other Ambulatory Visit: Payer: Self-pay

## 2018-09-23 ENCOUNTER — Encounter: Payer: Self-pay | Admitting: Obstetrics and Gynecology

## 2018-09-23 VITALS — BP 138/88 | Ht 63.0 in | Wt 242.0 lb

## 2018-09-23 DIAGNOSIS — Z1151 Encounter for screening for human papillomavirus (HPV): Secondary | ICD-10-CM

## 2018-09-23 DIAGNOSIS — N898 Other specified noninflammatory disorders of vagina: Secondary | ICD-10-CM | POA: Diagnosis not present

## 2018-09-23 DIAGNOSIS — Z1239 Encounter for other screening for malignant neoplasm of breast: Secondary | ICD-10-CM

## 2018-09-23 DIAGNOSIS — A6004 Herpesviral vulvovaginitis: Secondary | ICD-10-CM

## 2018-09-23 DIAGNOSIS — Z124 Encounter for screening for malignant neoplasm of cervix: Secondary | ICD-10-CM

## 2018-09-23 DIAGNOSIS — Z01419 Encounter for gynecological examination (general) (routine) without abnormal findings: Secondary | ICD-10-CM

## 2018-09-23 MED ORDER — METRONIDAZOLE 500 MG PO TABS: 500.0000 mg | ORAL_TABLET | Freq: Two times a day (BID) | ORAL | 0 refills | Status: AC

## 2018-09-23 NOTE — Patient Instructions (Addendum)
I value your feedback and entrusting us with your care. If you get a [facility] patient survey, I would appreciate you taking the time to let us know about your experience today. Thank you!  [facility]: [phone]  [facility]: [phone]  

## 2018-09-23 NOTE — Progress Notes (Signed)
PCP:  Patient, No Pcp Per   Chief Complaint  Patient presents with  . Gynecologic Exam     HPI:      Carmen Sosa is a 43 y.o. G2P0011 who LMP was Patient's last menstrual period was 09/12/2018., presents today for her annual examination.  Her menses are regular every 28-30 days, lasting 5 days.  Dysmenorrhea mild. She does not have intermenstrual bleeding.   Sex activity: single partner, contraception - none. No new partners. Tried OCPs last yr and had increased facial hair so stopped them. Declines BC for now.  Last Pap: June 26, 2015  Results were: no abnormalities /neg HPV DNA  Hx of STDs: HPV/LGSIL on pap 2012/2013; HSV, takes valtrex prn. Needs Rx RF to [facility]  Pt with increased d/c with "yeast" odor, no irritation since 8/20. Saw urgent care and treated with diflucan with temporary relief. I then refilled diflucan and sx have persisted. No prior abx.  Last mammogram: never did last yr; has appt 10/27/18 at [facility] There is no FH of breast cancer. There is no FH of ovarian cancer. The patient does not do self-breast exams.  Tobacco use: The patient denies current or previous tobacco use. Alcohol use: social drinker No drug use.  Exercise: moderately active  She does get adequate calcium and Vitamin D in her diet. Labs at work.   Past Medical History:  Diagnosis Date  . Cervical high risk human papillomavirus (HPV) DNA test positive 2012, 2013  . Genital herpes   . Irregular menses   . LGSIL on Pap smear of cervix 2012, 2013  . Obesity     Past Surgical History:  Procedure Laterality Date  . COLPOSCOPY  2012  . TONSILLECTOMY      Family History  Problem Relation Age of Onset  . Diabetes Father   . Breast cancer Neg Hx   . Ovarian cancer Neg Hx     Social History   Socioeconomic History  . Marital status: Married    Spouse name: Not on file  . Number of children: Not on file  . Years of education: Not on file  . Highest  education level: Not on file  Occupational History  . Not on file  Social Needs  . Financial resource strain: Not on file  . Food insecurity    Worry: Not on file    Inability: Not on file  . Transportation needs    Medical: Not on file    Non-medical: Not on file  Tobacco Use  . Smoking status: Never Smoker  . Smokeless tobacco: Never Used  Substance and Sexual Activity  . Alcohol use: Yes    Comment: occasioanlly  . Drug use: No  . Sexual activity: Yes    Birth control/protection: None  Lifestyle  . Physical activity    Days per week: Not on file    Minutes per session: Not on file  . Stress: Not on file  Relationships  . Social Herbalist on phone: Not on file    Gets together: Not on file    Attends religious service: Not on file    Active member of club or organization: Not on file    Attends meetings of clubs or organizations: Not on file    Relationship status: Not on file  . Intimate partner violence    Fear of current or ex partner: Not on file    Emotionally abused: Not on file  Physically abused: Not on file    Forced sexual activity: Not on file  Other Topics Concern  . Not on file  Social History Narrative  . Not on file    Outpatient Medications Prior to Visit  Medication Sig Dispense Refill  . ibuprofen (ADVIL) 800 MG tablet Take 1 tablet (800 mg total) by mouth every 8 (eight) hours as needed. 21 tablet 0  . traMADol (ULTRAM) 50 MG tablet Take 1 tablet (50 mg total) by mouth every 6 (six) hours as needed for severe pain. (Patient not taking: Reported on 09/23/2018) 15 tablet 0   No facility-administered medications prior to visit.     ROS:  Review of Systems  Constitutional: Negative for fatigue, fever and unexpected weight change.  Respiratory: Negative for cough, shortness of breath and wheezing.   Cardiovascular: Negative for chest pain, palpitations and leg swelling.  Gastrointestinal: Negative for blood in stool, constipation,  diarrhea, nausea and vomiting.  Endocrine: Negative for cold intolerance, heat intolerance and polyuria.  Genitourinary: Positive for vaginal discharge. Negative for dyspareunia, dysuria, flank pain, frequency, genital sores, hematuria, menstrual problem, pelvic pain, urgency, vaginal bleeding and vaginal pain.  Musculoskeletal: Negative for back pain, joint swelling and myalgias.  Skin: Negative for rash.  Neurological: Positive for headaches. Negative for dizziness, syncope, light-headedness and numbness.  Hematological: Negative for adenopathy.  Psychiatric/Behavioral: Negative for agitation, confusion, sleep disturbance and suicidal ideas. The patient is not nervous/anxious.   BREAST: No symptoms   Objective: BP 138/88   Ht 5\' 3"  (1.6 m)   Wt 242 lb (109.8 kg)   LMP 09/12/2018   BMI 42.87 kg/m    Physical Exam Constitutional:      Appearance: She is well-developed.  Genitourinary:     Vulva, cervix, uterus, right adnexa and left adnexa normal.     No vulval lesion or tenderness noted.     Vaginal discharge present.     No vaginal erythema or tenderness.     No cervical polyp.     Uterus is not enlarged or tender.     No right or left adnexal mass present.     Right adnexa not tender.     Left adnexa not tender.  Neck:     Musculoskeletal: Normal range of motion.     Thyroid: No thyromegaly.  Cardiovascular:     Rate and Rhythm: Normal rate and regular rhythm.     Heart sounds: Normal heart sounds. No murmur.  Pulmonary:     Effort: Pulmonary effort is normal.     Breath sounds: Normal breath sounds.  Chest:     Breasts:        Right: No mass, nipple discharge, skin change or tenderness.        Left: No mass, nipple discharge, skin change or tenderness.  Abdominal:     Palpations: Abdomen is soft.     Tenderness: There is no abdominal tenderness. There is no guarding.  Musculoskeletal: Normal range of motion.  Neurological:     General: No focal deficit present.      Mental Status: She is alert and oriented to person, place, and time.     Cranial Nerves: No cranial nerve deficit.  Skin:    General: Skin is warm and dry.  Psychiatric:        Mood and Affect: Mood normal.        Behavior: Behavior normal.        Thought Content: Thought content normal.  Judgment: Judgment normal.  Vitals signs reviewed.   RESULTS:  Results for orders placed or performed in visit on 09/23/18 (from the past 24 hour(s))  POCT Wet Prep with KOH     Status: Normal   Collection Time: 09/24/18  9:46 AM  Result Value Ref Range   Trichomonas, UA Negative    Clue Cells Wet Prep HPF POC neg    Epithelial Wet Prep HPF POC     Yeast Wet Prep HPF POC neg    Bacteria Wet Prep HPF POC     RBC Wet Prep HPF POC     WBC Wet Prep HPF POC     KOH Prep POC Negative Negative    Assessment/Plan: Encounter for annual routine gynecological examination  Cervical cancer screening - Plan: IGP, Aptima HPV  Screening for HPV (human papillomavirus) - Plan: IGP, Aptima HPV  Screening for breast cancer - Plan: Has mammo sched.   Herpes simplex vulvovaginitis--Rx RF valtrex prn sx to [facility].  Vaginal discharge - Plan: metroNIDAZOLE (FLAGYL) 500 MG tablet, POCT Wet Prep with KOH; Neg wet prep/pos sx. Treat empirically for BV. No yeast on wet prep and failed diflucan tx x 2. If sx persist, pt to [facility] for culture.  Meds ordered this encounter  Medications  . metroNIDAZOLE (FLAGYL) 500 MG tablet    Sig: Take 1 tablet (500 mg total) by mouth 2 (two) times daily for 7 days.    Dispense:  14 tablet    Refill:  0    Order Specific Question:   Supervising Provider    Answer:   Gae Dry [id number]  . valACYclovir (VALTREX) 500 MG tablet    Sig: Take 1 tablet (500 mg total) by mouth 2 (two) times daily for 3 days. Prn sx    Dispense:  30 tablet    Refill:  1    Order Specific Question:   Supervising Provider    Answer:   Gae Dry [id number]             GYN counsel  breast self exam, mammography screening, adequate intake of calcium and vitamin D, diet and exercise     F/U  Return in about 1 year (around 09/23/2019).  Celene Pippins B. Anshika Pethtel, PA-C 09/24/2018 11:13 AM

## 2018-09-24 LAB — POCT WET PREP WITH KOH
Clue Cells Wet Prep HPF POC: NEGATIVE
KOH Prep POC: NEGATIVE
Trichomonas, UA: NEGATIVE
Yeast Wet Prep HPF POC: NEGATIVE

## 2018-09-24 MED ORDER — VALACYCLOVIR HCL 500 MG PO TABS: 500.0000 mg | ORAL_TABLET | Freq: Two times a day (BID) | ORAL | 1 refills | Status: DC

## 2018-09-29 LAB — IGP, APTIMA HPV: HPV Aptima: NEGATIVE

## 2018-10-14 ENCOUNTER — Other Ambulatory Visit: Payer: Self-pay | Admitting: Sports Medicine

## 2018-10-14 DIAGNOSIS — M25512 Pain in left shoulder: Secondary | ICD-10-CM

## 2018-10-27 ENCOUNTER — Ambulatory Visit: Payer: Managed Care, Other (non HMO)

## 2018-10-28 ENCOUNTER — Other Ambulatory Visit: Payer: Self-pay | Admitting: Sports Medicine

## 2018-10-29 ENCOUNTER — Ambulatory Visit
Admission: RE | Admit: 2018-10-29 | Discharge: 2018-10-29 | Disposition: A | Discharge status: Home / Self Care | Payer: Managed Care, Other (non HMO) | Source: Ambulatory Visit | Attending: Sports Medicine | Admitting: Sports Medicine

## 2018-10-29 ENCOUNTER — Other Ambulatory Visit: Payer: Self-pay

## 2018-10-29 DIAGNOSIS — M25512 Pain in left shoulder: Secondary | ICD-10-CM

## 2018-10-29 MED ORDER — IOPAMIDOL (ISOVUE-M 200) INJECTION 41%
13.0000 mL | Freq: Once | INTRAMUSCULAR | Status: AC
  Administered 2018-10-29: 13 mL via INTRA_ARTICULAR

## 2018-11-03 ENCOUNTER — Other Ambulatory Visit: Payer: Self-pay | Admitting: Sports Medicine

## 2018-11-03 DIAGNOSIS — M542 Cervicalgia: Secondary | ICD-10-CM

## 2018-11-03 DIAGNOSIS — M5020 Other cervical disc displacement, unspecified cervical region: Secondary | ICD-10-CM

## 2018-11-14 ENCOUNTER — Other Ambulatory Visit: Payer: Self-pay

## 2018-11-14 ENCOUNTER — Ambulatory Visit
Admission: RE | Admit: 2018-11-14 | Discharge: 2018-11-14 | Disposition: A | Discharge status: Home / Self Care | Payer: Managed Care, Other (non HMO) | Source: Ambulatory Visit | Attending: Sports Medicine | Admitting: Sports Medicine

## 2018-11-14 DIAGNOSIS — M5020 Other cervical disc displacement, unspecified cervical region: Secondary | ICD-10-CM

## 2018-11-14 DIAGNOSIS — M542 Cervicalgia: Secondary | ICD-10-CM

## 2018-12-08 ENCOUNTER — Other Ambulatory Visit: Payer: Self-pay

## 2018-12-08 ENCOUNTER — Ambulatory Visit
Admission: RE | Admit: 2018-12-08 | Discharge: 2018-12-08 | Disposition: A | Discharge status: Home / Self Care | Payer: Managed Care, Other (non HMO) | Source: Ambulatory Visit | Attending: Endocrinology | Admitting: Endocrinology

## 2018-12-08 DIAGNOSIS — Z1231 Encounter for screening mammogram for malignant neoplasm of breast: Secondary | ICD-10-CM

## 2018-12-15 ENCOUNTER — Encounter: Payer: Self-pay | Admitting: Obstetrics and Gynecology

## 2019-06-15 ENCOUNTER — Telehealth: Payer: Self-pay

## 2019-06-15 NOTE — Telephone Encounter (Signed)
Pt called triage line stating she was experiencing discharge.   Per Almyra Free, there is an opening this afternoon with [facility]. Almyra Free had to leave pt a message on number provided. I also called pt.

## 2019-07-12 ENCOUNTER — Other Ambulatory Visit: Payer: Self-pay | Admitting: Obstetrics and Gynecology

## 2019-07-12 DIAGNOSIS — A6004 Herpesviral vulvovaginitis: Secondary | ICD-10-CM

## 2019-07-13 NOTE — Telephone Encounter (Signed)
Please advise 

## 2019-07-28 ENCOUNTER — Telehealth: Payer: Self-pay

## 2019-07-28 DIAGNOSIS — N926 Irregular menstruation, unspecified: Secondary | ICD-10-CM

## 2019-07-28 NOTE — Telephone Encounter (Signed)
Pt calling; in June her period came early bright red spotting, paused, then next day dark brownish spotting; July more brown spotting coming out.  [phone]

## 2019-07-28 NOTE — Telephone Encounter (Signed)
Pt's menses 1 wk early last month but lighter with spotting for about 7 days. Menses on time this cycle, spotting for a few days. She is sex active, 2 neg home UPT. No BTB. Will chck thyroid and f/u with results. Discussed concern with AUB but lighter, monthly menses can be normal perimenopausal bleeding. If labs neg, follow cycles and f/u at 9/21 annual.

## 2019-07-29 ENCOUNTER — Other Ambulatory Visit: Payer: Self-pay

## 2019-07-29 ENCOUNTER — Other Ambulatory Visit: Payer: Managed Care, Other (non HMO)

## 2019-07-29 DIAGNOSIS — N926 Irregular menstruation, unspecified: Secondary | ICD-10-CM

## 2019-07-30 LAB — TSH+FREE T4
Free T4: 1.13 ng/dL (ref 0.82–1.77)
TSH: 1.04 u[IU]/mL (ref 0.450–4.500)

## 2019-09-26 NOTE — Progress Notes (Signed)
PCP:  Patient, No Pcp Per   Chief Complaint  Patient presents with  . Gynecologic Exam    HPI:      Carmen Sosa is a 44 y.o. G2P0011 who LMP was Patient's last menstrual period was 09/06/2019., presents today for her annual examination.  Her menses are regular every 28-30 days, lasting 5-6 days.  Dysmenorrhea mild. She does not have intermenstrual bleeding. Had lighter flow for 2 cycles over the summer with normal thyroid. Menses have returned to normal.   Sex activity: single partner, contraception - none. No new partners. Tried OCPs in past but had increased facial hair so stopped them. Declines BC for now.  Last Pap: 09/23/18   Results were: ASCUS /neg HPV DNA; repeat pap due  Hx of STDs: HPV/LGSIL on pap 2012/2013; HSV, takes valtrex prn. Needs Rx [facility]  Last mammogram: 12/08/18 Results were normal, repeat in 12 months There is no FH of breast cancer. There is no FH of ovarian cancer. The patient does not do self-breast exams.  Tobacco use: The patient denies current or previous tobacco use. Alcohol use: social drinker No drug use.  Exercise: moderately active  She does get adequate calcium and Vitamin D in her diet. Labs at work.   Has had elevated blood pressures at home over the past couple of wks with headaches. No FH HTN.   Past Medical History:  Diagnosis Date  . Cervical high risk human papillomavirus (HPV) DNA test positive 2012, 2013  . Genital herpes   . Irregular menses   . LGSIL on Pap smear of cervix 2012, 2013  . Obesity     Past Surgical History:  Procedure Laterality Date  . COLPOSCOPY  2012  . TONSILLECTOMY      Family History  Problem Relation Age of Onset  . Diabetes Father   . Breast cancer Neg Hx   . Ovarian cancer Neg Hx     Social History   Socioeconomic History  . Marital status: Divorced    Spouse name: Not on file  . Number of children: Not on file  . Years of education: Not on file  . Highest education level: Not  on file  Occupational History  . Not on file  Tobacco Use  . Smoking status: Never Smoker  . Smokeless tobacco: Never Used  Vaping Use  . Vaping Use: Never used  Substance and Sexual Activity  . Alcohol use: Yes    Comment: occasioanlly  . Drug use: No  . Sexual activity: Yes    Birth control/protection: None  Other Topics Concern  . Not on file  Social History Narrative  . Not on file   Social Determinants of Health   Financial Resource Strain:   . Difficulty of Paying Living Expenses: Not on file  Food Insecurity:   . Worried About Charity fundraiser in the Last Year: Not on file  . Ran Out of Food in the Last Year: Not on file  Transportation Needs:   . Lack of Transportation (Medical): Not on file  . Lack of Transportation (Non-Medical): Not on file  Physical Activity:   . Days of Exercise per Week: Not on file  . Minutes of Exercise per Session: Not on file  Stress:   . Feeling of Stress : Not on file  Social Connections:   . Frequency of Communication with Friends and Family: Not on file  . Frequency of Social Gatherings with Friends and Family: Not on  file  . Attends Religious Services: Not on file  . Active Member of Clubs or Organizations: Not on file  . Attends Archivist Meetings: Not on file  . Marital Status: Not on file  Intimate Partner Violence:   . Fear of Current or Ex-Partner: Not on file  . Emotionally Abused: Not on file  . Physically Abused: Not on file  . Sexually Abused: Not on file    Outpatient Medications Prior to Visit  Medication Sig Dispense Refill  . ibuprofen (ADVIL) 800 MG tablet Take 1 tablet (800 mg total) by mouth every 8 (eight) hours as needed. (Patient not taking: Reported on 09/27/2019) 21 tablet 0  . traMADol (ULTRAM) 50 MG tablet Take 1 tablet (50 mg total) by mouth every 6 (six) hours as needed for severe pain. (Patient not taking: Reported on 09/23/2018) 15 tablet 0   No facility-administered medications prior to  visit.    ROS:  Review of Systems  Constitutional: Negative for fatigue, fever and unexpected weight change.  Respiratory: Negative for cough, shortness of breath and wheezing.   Cardiovascular: Negative for chest pain, palpitations and leg swelling.  Gastrointestinal: Negative for blood in stool, constipation, diarrhea, nausea and vomiting.  Endocrine: Negative for cold intolerance, heat intolerance and polyuria.  Genitourinary: Negative for dyspareunia, dysuria, flank pain, frequency, genital sores, hematuria, menstrual problem, pelvic pain, urgency, vaginal bleeding, vaginal discharge and vaginal pain.  Musculoskeletal: Negative for back pain, joint swelling and myalgias.  Skin: Negative for rash.  Neurological: Positive for headaches. Negative for dizziness, syncope, light-headedness and numbness.  Hematological: Negative for adenopathy.  Psychiatric/Behavioral: Negative for agitation, confusion, sleep disturbance and suicidal ideas. The patient is not nervous/anxious.   BREAST: No symptoms   Objective: BP 132/90   Ht 5\' 4"  (1.626 m)   Wt 244 lb (110.7 kg)   LMP 09/06/2019   BMI 41.88 kg/m  BP 152/98 initially  Physical Exam Constitutional:      Appearance: She is well-developed.  Genitourinary:     Vulva, vagina, cervix, uterus, right adnexa and left adnexa normal.     No vulval lesion or tenderness noted.     No vaginal discharge, erythema or tenderness.     No cervical polyp.     Uterus is not enlarged or tender.     No right or left adnexal mass present.     Right adnexa not tender.     Left adnexa not tender.  Neck:     Thyroid: No thyromegaly.  Cardiovascular:     Rate and Rhythm: Normal rate and regular rhythm.     Heart sounds: Normal heart sounds. No murmur heard.   Pulmonary:     Effort: Pulmonary effort is normal.     Breath sounds: Normal breath sounds.  Chest:     Breasts:        Right: No mass, nipple discharge, skin change or tenderness.         Left: No mass, nipple discharge, skin change or tenderness.  Abdominal:     Palpations: Abdomen is soft.     Tenderness: There is no abdominal tenderness. There is no guarding.  Musculoskeletal:        General: Normal range of motion.     Cervical back: Normal range of motion.  Neurological:     General: No focal deficit present.     Mental Status: She is alert and oriented to person, place, and time.     Cranial Nerves: No cranial  nerve deficit.  Skin:    General: Skin is warm and dry.  Psychiatric:        Mood and Affect: Mood normal.        Behavior: Behavior normal.        Thought Content: Thought content normal.        Judgment: Judgment normal.  Vitals reviewed.    Assessment/Plan: Encounter for annual routine gynecological examination  Cervical cancer screening - Plan: IGP, Aptima HPV  Screening for HPV (human papillomavirus) - Plan: IGP, Aptima HPV  Encounter for screening mammogram for malignant neoplasm of breast - Plan: MM 3D SCREEN BREAST BILATERAL; pt to sched mammo  Herpes simplex vulvovaginitis - Plan: valACYclovir (VALTREX) 500 MG tablet; Rx RF to [facility]  Elevated blood pressure reading in office without diagnosis of hypertension--improved with recheck. Minimize caffeine and sodium. Cont to check BP at home. If still elevated, f/u with PCP.     Meds ordered this encounter  Medications  . valACYclovir (VALTREX) 500 MG tablet    Sig: Take 1 tablet (500 mg total) by mouth 2 (two) times daily for 3 days. Prn sx    Dispense:  30 tablet    Refill:  1    Order Specific Question:   Supervising Provider    Answer:   Gae Dry [[id number]]             GYN counsel breast self exam, mammography screening, adequate intake of calcium and vitamin D, diet and exercise     F/U  Return in about 1 year (around 09/26/2020).  Braedan Meuth B. Jadi Deyarmin, PA-C 09/27/2019 8:38 AM

## 2019-09-27 ENCOUNTER — Encounter: Payer: Self-pay | Admitting: Obstetrics and Gynecology

## 2019-09-27 ENCOUNTER — Ambulatory Visit (INDEPENDENT_AMBULATORY_CARE_PROVIDER_SITE_OTHER): Payer: Managed Care, Other (non HMO) | Admitting: Obstetrics and Gynecology

## 2019-09-27 ENCOUNTER — Other Ambulatory Visit: Payer: Self-pay

## 2019-09-27 VITALS — BP 132/90 | Ht 64.0 in | Wt 244.0 lb

## 2019-09-27 DIAGNOSIS — A6004 Herpesviral vulvovaginitis: Secondary | ICD-10-CM

## 2019-09-27 DIAGNOSIS — Z01419 Encounter for gynecological examination (general) (routine) without abnormal findings: Secondary | ICD-10-CM | POA: Diagnosis not present

## 2019-09-27 DIAGNOSIS — Z1151 Encounter for screening for human papillomavirus (HPV): Secondary | ICD-10-CM | POA: Diagnosis not present

## 2019-09-27 DIAGNOSIS — R030 Elevated blood-pressure reading, without diagnosis of hypertension: Secondary | ICD-10-CM

## 2019-09-27 DIAGNOSIS — Z124 Encounter for screening for malignant neoplasm of cervix: Secondary | ICD-10-CM | POA: Diagnosis not present

## 2019-09-27 DIAGNOSIS — Z1231 Encounter for screening mammogram for malignant neoplasm of breast: Secondary | ICD-10-CM

## 2019-09-27 MED ORDER — VALACYCLOVIR HCL 500 MG PO TABS: 500.0000 mg | ORAL_TABLET | Freq: Two times a day (BID) | ORAL | 1 refills | Status: DC

## 2019-09-27 NOTE — Patient Instructions (Signed)
I value your feedback and entrusting us with your care. If you get a [facility] patient survey, I would appreciate you taking the time to let us know about your experience today. Thank you!  As of December 24, 2018, your lab results will be released to your MyChart immediately, before I even have a chance to see them. Please give me time to review them and contact you if there are any abnormalities. Thank you for your patience.   [facility]: [phone]  [facility]: [phone]  

## 2019-09-29 LAB — IGP, APTIMA HPV: HPV Aptima: NEGATIVE

## 2020-07-31 ENCOUNTER — Other Ambulatory Visit: Payer: Self-pay

## 2020-07-31 ENCOUNTER — Ambulatory Visit
Admission: RE | Admit: 2020-07-31 | Discharge: 2020-07-31 | Disposition: A | Discharge status: Home / Self Care | Payer: Managed Care, Other (non HMO) | Source: Ambulatory Visit | Attending: Obstetrics and Gynecology | Admitting: Obstetrics and Gynecology

## 2020-07-31 DIAGNOSIS — Z1231 Encounter for screening mammogram for malignant neoplasm of breast: Secondary | ICD-10-CM

## 2020-09-26 NOTE — Progress Notes (Signed)
PCP:  Beverley Fiedler, FNP   Chief Complaint  Patient presents with   Gynecologic Exam    Hot flashes x 4 months     HPI:      [name] is a 45 y.o. G2P0011 who LMP was Patient's last menstrual period was 09/16/2020 (exact date)., presents today for her annual examination.  Her menses are usually regular every 28-30 days (June period was a couple wks late), lasting 4-6 days, heavy flow, changing products every 30 min for 2 days, with quarter sized flots. Flow is heavier this yr, changing more like every 1+ hrs last yr.   Dysmenorrhea mild. She does not have intermenstrual bleeding. Not taking Fe supp. Has been having vasomotor sx for the past few months. No increased stress, no recent thyroid labs.   Sex activity: single partner, contraception - none. No new partners. Tried OCPs in past but had increased facial hair so stopped them. Interested in [facility], doesn't want pills or depo. Hasn't done IUD.   Last Pap: 09/27/19 Results were normal/neg HPV DNA.  Hx of STDs: HPV/LGSIL on pap 2012/2013; HSV, takes valtrex prn. Needs Rx [facility] to [facility]  Last mammogram: 07/31/20 Results were normal, repeat in 12 months There is no FH of breast cancer. There is no FH of ovarian cancer. The patient does not do self-breast exams.  Tobacco use: The patient denies current or previous tobacco use. Alcohol use: social drinker No drug use.  Exercise: moderately active  She does get adequate calcium and Vitamin D in her diet. Labs at work. No hx of pre-DM.   Had elevated Bps at home last yr that resolved. BP usually 135/80 at home per pt.   Past Medical History:  Diagnosis Date   Cervical high risk human papillomavirus (HPV) DNA test positive 2012, 2013   Genital herpes    Irregular menses    LGSIL on Pap smear of cervix 2012, 2013   Obesity     Past Surgical History:  Procedure Laterality Date   COLPOSCOPY  2012   TONSILLECTOMY      Family History  Problem Relation Age of Onset    Diabetes Father    Breast cancer Neg Hx    Ovarian cancer Neg Hx     Social History   Socioeconomic History   Marital status: Divorced    Spouse name: Not on file   Number of children: Not on file   Years of education: Not on file   Highest education level: Not on file  Occupational History   Not on file  Tobacco Use   Smoking status: Never   Smokeless tobacco: Never  Vaping Use   Vaping Use: Never used  Substance and Sexual Activity   Alcohol use: Yes    Comment: occasioanlly   Drug use: No   Sexual activity: Yes    Birth control/protection: None  Other Topics Concern   Not on file  Social History Narrative   Not on file   Social Determinants of Health   Financial Resource Strain: Not on file  Food Insecurity: Not on file  Transportation Needs: Not on file  Physical Activity: Not on file  Stress: Not on file  Social Connections: Not on file  Intimate Partner Violence: Not on file    Outpatient Medications Prior to Visit  Medication Sig Dispense Refill   ibuprofen (ADVIL) 800 MG tablet Take 1 tablet (800 mg total) by mouth every 8 (eight) hours as needed. 21 tablet  0   traMADol (ULTRAM) 50 MG tablet Take 1 tablet (50 mg total) by mouth every 6 (six) hours as needed for severe pain. (Patient not taking: Reported on 09/23/2018) 15 tablet 0   No facility-administered medications prior to visit.    ROS:  Review of Systems  Constitutional:  Negative for fatigue, fever and unexpected weight change.  Respiratory:  Negative for cough, shortness of breath and wheezing.   Cardiovascular:  Negative for chest pain, palpitations and leg swelling.  Gastrointestinal:  Negative for blood in stool, constipation, diarrhea, nausea and vomiting.  Endocrine: Negative for cold intolerance, heat intolerance and polyuria.  Genitourinary:  Negative for dyspareunia, dysuria, flank pain, frequency, genital sores, hematuria, menstrual problem, pelvic pain, urgency, vaginal bleeding,  vaginal discharge and vaginal pain.  Musculoskeletal:  Negative for back pain, joint swelling and myalgias.  Skin:  Negative for rash.  Neurological:  Negative for dizziness, syncope, light-headedness, numbness and headaches.  Hematological:  Negative for adenopathy.  Psychiatric/Behavioral:  Negative for agitation, confusion, sleep disturbance and suicidal ideas. The patient is not nervous/anxious.  BREAST: No symptoms   Objective: BP (!) 150/80   Ht '5\' 4"'$  (1.626 m)   Wt 243 lb (110.2 kg)   LMP 09/16/2020 (Exact Date)   BMI 41.71 kg/m  BP 152/98 initially  Physical Exam Constitutional:      Appearance: She is well-developed.  Genitourinary:     Vulva normal.     Right Labia: No rash, tenderness or lesions.    Left Labia: No tenderness, lesions or rash.    No vaginal discharge, erythema or tenderness.      Right Adnexa: not tender and no mass present.    Left Adnexa: not tender and no mass present.    No cervical friability or polyp.     Uterus is not enlarged or tender.  Breasts:    Right: No mass, nipple discharge, skin change or tenderness.     Left: No mass, nipple discharge, skin change or tenderness.  Neck:     Thyroid: No thyromegaly.  Cardiovascular:     Rate and Rhythm: Normal rate and regular rhythm.     Heart sounds: Normal heart sounds. No murmur heard. Pulmonary:     Effort: Pulmonary effort is normal.     Breath sounds: Normal breath sounds.  Abdominal:     Palpations: Abdomen is soft.     Tenderness: There is no abdominal tenderness. There is no guarding or rebound.  Musculoskeletal:        General: Normal range of motion.     Cervical back: Normal range of motion.  Lymphadenopathy:     Cervical: No cervical adenopathy.  Neurological:     General: No focal deficit present.     Mental Status: She is alert and oriented to person, place, and time.     Cranial Nerves: No cranial nerve deficit.  Skin:    General: Skin is warm and dry.  Psychiatric:         Mood and Affect: Mood normal.        Behavior: Behavior normal.        Thought Content: Thought content normal.        Judgment: Judgment normal.  Vitals reviewed.   Assessment/Plan: Encounter for annual routine gynecological examination  Encounter for initial prescription of intrauterine contraceptive device (IUD); IUD pros/cons/procedure discussed. Pt to research Mirena and f/u prn questions/with menses for insertion.   Encounter for screening mammogram for malignant neoplasm of breast--pt current  on mammo  Screening for colon cancer - Plan: Ambulatory referral to Gastroenterology; refer to GI for scr colonoscopy due to age  Menorrhagia with regular cycle - Plan: CBC with Differential/Platelet; worsening sx. Check labs. Discussed IUD for mgmt. Pt to f/u for insertion if desires, or can discuss other tx options. Not taking Fe supp.   Screening for deficiency anemia - Plan: CBC with Differential/Platelet  Perimenopausal vasomotor symptoms - Plan: TSH + free T4; check labs. If WNL, most likely perimenopausal.   Thyroid disorder screening - Plan: TSH + free T4  Herpes simplex vulvovaginitis - Plan: valACYclovir (VALTREX) 500 MG tablet; Rx RF valtrex prn to [facility]  Elevated blood pressure reading in office without diagnosis of hypertension; BP improved with repeat, WNL at home. Pt to cont to keep check.    Meds ordered this encounter  Medications   valACYclovir (VALTREX) 500 MG tablet    Sig: Take 1 tablet (500 mg total) by mouth 2 (two) times daily for 3 days. Prn sx    Dispense:  30 tablet    Refill:  1    Order Specific Question:   Supervising Provider    Answer:   Gae Dry [id number]              GYN counsel breast self exam, mammography screening, adequate intake of calcium and vitamin D, diet and exercise     F/U  Return in about 1 year (around 09/27/2021).  Merie Wulf B. Jeffey Janssen, PA-C 09/27/2020 11:56 AM

## 2020-09-27 ENCOUNTER — Ambulatory Visit (INDEPENDENT_AMBULATORY_CARE_PROVIDER_SITE_OTHER): Payer: Managed Care, Other (non HMO) | Admitting: Obstetrics and Gynecology

## 2020-09-27 ENCOUNTER — Encounter: Payer: Self-pay | Admitting: Obstetrics and Gynecology

## 2020-09-27 ENCOUNTER — Other Ambulatory Visit: Payer: Self-pay

## 2020-09-27 VITALS — BP 150/80 | Ht 64.0 in | Wt 243.0 lb

## 2020-09-27 DIAGNOSIS — N951 Menopausal and female climacteric states: Secondary | ICD-10-CM

## 2020-09-27 DIAGNOSIS — Z1329 Encounter for screening for other suspected endocrine disorder: Secondary | ICD-10-CM

## 2020-09-27 DIAGNOSIS — Z1231 Encounter for screening mammogram for malignant neoplasm of breast: Secondary | ICD-10-CM | POA: Diagnosis not present

## 2020-09-27 DIAGNOSIS — A6004 Herpesviral vulvovaginitis: Secondary | ICD-10-CM

## 2020-09-27 DIAGNOSIS — Z130 Encounter for screening for diseases of the blood and blood-forming organs and certain disorders involving the immune mechanism: Secondary | ICD-10-CM

## 2020-09-27 DIAGNOSIS — Z01419 Encounter for gynecological examination (general) (routine) without abnormal findings: Secondary | ICD-10-CM | POA: Diagnosis not present

## 2020-09-27 DIAGNOSIS — R030 Elevated blood-pressure reading, without diagnosis of hypertension: Secondary | ICD-10-CM

## 2020-09-27 DIAGNOSIS — Z1211 Encounter for screening for malignant neoplasm of colon: Secondary | ICD-10-CM

## 2020-09-27 DIAGNOSIS — Z30014 Encounter for initial prescription of intrauterine contraceptive device: Secondary | ICD-10-CM | POA: Diagnosis not present

## 2020-09-27 DIAGNOSIS — N920 Excessive and frequent menstruation with regular cycle: Secondary | ICD-10-CM | POA: Insufficient documentation

## 2020-09-27 MED ORDER — VALACYCLOVIR HCL 500 MG PO TABS: 500.0000 mg | ORAL_TABLET | Freq: Two times a day (BID) | ORAL | 1 refills | Status: AC

## 2020-09-27 NOTE — Patient Instructions (Signed)
I value your feedback and you entrusting us with your care. If you get a [facility] patient survey, I would appreciate you taking the time to let us know about your experience today. Thank you! ? ? ?

## 2020-09-28 LAB — CBC WITH DIFFERENTIAL/PLATELET
Basophils Absolute: 0 10*3/uL (ref 0.0–0.2)
Basos: 1 %
EOS (ABSOLUTE): 0.1 10*3/uL (ref 0.0–0.4)
Eos: 1 %
Hematocrit: 31.9 % — ABNORMAL LOW (ref 34.0–46.6)
Hemoglobin: 9.6 g/dL — ABNORMAL LOW (ref 11.1–15.9)
Immature Grans (Abs): 0 10*3/uL (ref 0.0–0.1)
Immature Granulocytes: 0 %
Lymphocytes Absolute: 2.4 10*3/uL (ref 0.7–3.1)
Lymphs: 42 %
MCH: 23.1 pg — ABNORMAL LOW (ref 26.6–33.0)
MCHC: 30.1 g/dL — ABNORMAL LOW (ref 31.5–35.7)
MCV: 77 fL — ABNORMAL LOW (ref 79–97)
Monocytes Absolute: 0.7 10*3/uL (ref 0.1–0.9)
Monocytes: 12 %
Neutrophils Absolute: 2.5 10*3/uL (ref 1.4–7.0)
Neutrophils: 44 %
Platelets: 494 10*3/uL — ABNORMAL HIGH (ref 150–450)
RBC: 4.16 x10E6/uL (ref 3.77–5.28)
RDW: 17.1 % — ABNORMAL HIGH (ref 11.7–15.4)
WBC: 5.6 10*3/uL (ref 3.4–10.8)

## 2020-09-28 LAB — TSH+FREE T4
Free T4: 1.07 ng/dL (ref 0.82–1.77)
TSH: 0.821 u[IU]/mL (ref 0.450–4.500)

## 2020-10-04 ENCOUNTER — Encounter: Payer: Self-pay | Admitting: Gastroenterology

## 2020-10-18 ENCOUNTER — Encounter: Payer: Self-pay | Admitting: Obstetrics and Gynecology

## 2020-10-27 ENCOUNTER — Encounter: Payer: Self-pay | Admitting: Gastroenterology

## 2020-10-27 ENCOUNTER — Ambulatory Visit (AMBULATORY_SURGERY_CENTER): Payer: Managed Care, Other (non HMO) | Admitting: *Deleted

## 2020-10-27 ENCOUNTER — Other Ambulatory Visit: Payer: Self-pay

## 2020-10-27 VITALS — Ht 64.0 in | Wt 243.0 lb

## 2020-10-27 DIAGNOSIS — Z1211 Encounter for screening for malignant neoplasm of colon: Secondary | ICD-10-CM

## 2020-10-27 MED ORDER — PEG-KCL-NACL-NASULF-NA ASC-C 100 G PO SOLR: 1.0000 | Freq: Once | ORAL | 0 refills | Status: AC

## 2020-10-27 NOTE — Progress Notes (Signed)
Virtual pre visit completed over telephone.  Instructions forwarded through MyChart and sent to e-mail [email or web address] Mailed coupon.    No egg or soy allergy known to patient  No issues known to pt with past sedation with any surgeries or procedures Patient denies ever being told they had issues or difficulty with intubation  No FH of Malignant Hyperthermia Pt is not on diet pills Pt is not on  home 02  Pt is not on blood thinners  Pt denies issues with constipation  No A fib or A flutter Discussed with pt there will be an out-of-pocket cost for prep and that varies from $0 to 70 +  dollars - pt verbalized understanding   Due to the COVID-19 pandemic we are asking patients to follow certain guidelines in [facility] and the [facility]   Pt aware of COVID protocols and [facility] guidelines

## 2020-11-07 ENCOUNTER — Encounter: Payer: Self-pay | Admitting: Gastroenterology

## 2020-11-07 ENCOUNTER — Ambulatory Visit (AMBULATORY_SURGERY_CENTER): Payer: Managed Care, Other (non HMO) | Admitting: Gastroenterology

## 2020-11-07 VITALS — BP 134/73 | HR 65 | Temp 98.4°F | Resp 11 | Ht 64.0 in | Wt 243.0 lb

## 2020-11-07 DIAGNOSIS — D122 Benign neoplasm of ascending colon: Secondary | ICD-10-CM

## 2020-11-07 DIAGNOSIS — K635 Polyp of colon: Secondary | ICD-10-CM

## 2020-11-07 DIAGNOSIS — Z1211 Encounter for screening for malignant neoplasm of colon: Secondary | ICD-10-CM

## 2020-11-07 MED ORDER — SODIUM CHLORIDE 0.9 % IV SOLN: 500.0000 mL | Freq: Once | INTRAVENOUS | Status: DC

## 2020-11-07 NOTE — Progress Notes (Signed)
Pt Drowsy. VSS. To PACU, report to RN. No anesthetic complications noted.  

## 2020-11-07 NOTE — Progress Notes (Signed)
Pt's states no medical or surgical changes since previsit or office visit. 

## 2020-11-07 NOTE — Op Note (Signed)
[facility] Patient Name: Carmen Sosa Procedure Date: 11/07/2020 10:38 AM MRN: [id number] Endoscopist: Mallie Mussel L. Carmen Sosa , MD Age: 45 Referring MD:  Date of Birth: Oct 02, 1975 Gender: Female Account #: [id number] Procedure:                Colonoscopy Indications:              Screening for colorectal malignant neoplasm, This                            is the patient's first colonoscopy Medicines:                Monitored Anesthesia Care Procedure:                Pre-Anesthesia Assessment:                           - Prior to the procedure, a History and Physical                            was performed, and patient medications and                            allergies were reviewed. The patient's tolerance of                            previous anesthesia was also reviewed. The risks                            and benefits of the procedure and the sedation                            options and risks were discussed with the patient.                            All questions were answered, and informed consent                            was obtained. Prior Anticoagulants: The patient has                            taken no previous anticoagulant or antiplatelet                            agents. ASA Grade Assessment: II - A patient with                            mild systemic disease. After reviewing the risks                            and benefits, the patient was deemed in                            satisfactory condition to undergo the procedure.  After obtaining informed consent, the colonoscope                            was passed under direct vision. Throughout the                            procedure, the patient's blood pressure, pulse, and                            oxygen saturations were monitored continuously. The                            Olympus CF-HQ190L ([id number]) Colonoscope was                            introduced through the  anus and advanced to the the                            cecum, identified by appendiceal orifice and                            ileocecal valve. The colonoscopy was somewhat                            difficult due to a redundant colon. Successful                            completion of the procedure was aided by changing                            the patient to a semi-prone position and                            straightening and shortening the scope to obtain                            bowel loop reduction. The patient tolerated the                            procedure well. The quality of the bowel                            preparation was good. The ileocecal valve,                            appendiceal orifice, and rectum were photographed. Scope In: 10:46:47 AM Scope Out: 11:00:38 AM Scope Withdrawal Time: 0 hours 10 minutes 19 seconds  Total Procedure Duration: 0 hours 13 minutes 51 seconds  Findings:                 The perianal and digital rectal examinations were                            normal.  A diminutive polyp was found in the ascending                            colon. The polyp was semi-sessile. The polyp was                            removed with a cold snare. Resection and retrieval                            were complete.                           A few diverticula were found in the left colon.                           The exam was otherwise without abnormality on                            direct and retroflexion views. Complications:            No immediate complications. Estimated Blood Loss:     Estimated blood loss was minimal. Impression:               - One diminutive polyp in the ascending colon,                            removed with a cold snare. Resected and retrieved.                           - Diverticulosis in the left colon.                           - The examination was otherwise normal on direct                             and retroflexion views. Recommendation:           - Patient has a contact number available for                            emergencies. The signs and symptoms of potential                            delayed complications were discussed with the                            patient. Return to normal activities tomorrow.                            Written discharge instructions were provided to the                            patient.                           - Resume previous diet.                           -  Continue present medications.                           - Await pathology results.                           - Repeat colonoscopy is recommended for                            surveillance. The colonoscopy date will be                            determined after pathology results from today's                            exam become available for review. Kelsie Zaborowski L. Carmen Carrow, MD 11/07/2020 11:06:58 AM This report has been signed electronically.

## 2020-11-07 NOTE — Patient Instructions (Addendum)
Handouts provided to you today on polyps and diverticulosis  YOU HAD AN ENDOSCOPIC PROCEDURE TODAY AT [facility]:   Refer to the procedure report that was given to you for any specific questions about what was found during the examination.  If the procedure report does not answer your questions, please call your gastroenterologist to clarify.  If you requested that your care partner not be given the details of your procedure findings, then the procedure report has been included in a sealed envelope for you to review at your convenience later.  YOU SHOULD EXPECT: Some feelings of bloating in the abdomen. Passage of more gas than usual.  Walking can help get rid of the air that was put into your GI tract during the procedure and reduce the bloating. If you had a lower endoscopy (such as a colonoscopy or flexible sigmoidoscopy) you may notice spotting of blood in your stool or on the toilet paper. If you underwent a bowel prep for your procedure, you may not have a normal bowel movement for a few days.  Please Note:  You might notice some irritation and congestion in your nose or some drainage.  This is from the oxygen used during your procedure.  There is no need for concern and it should clear up in a day or so.  SYMPTOMS TO REPORT IMMEDIATELY:  Following lower endoscopy (colonoscopy or flexible sigmoidoscopy):  Excessive amounts of blood in the stool  Significant tenderness or worsening of abdominal pains  Swelling of the abdomen that is new, acute  Fever of 100F or higher  For urgent or emergent issues, a gastroenterologist can be reached at any hour by calling [phone]. Do not use MyChart messaging for urgent concerns.    DIET:  We do recommend a small meal at first, but then you may proceed to your regular diet.  Drink plenty of fluids but you should avoid alcoholic beverages for 24 hours.  ACTIVITY:  You should plan to take it easy for the rest of today and you  should NOT DRIVE or use heavy machinery until tomorrow (because of the sedation medicines used during the test).    FOLLOW UP: Our staff will call the number listed on your records 48-72 hours following your procedure to check on you and address any questions or concerns that you may have regarding the information given to you following your procedure. If we do not reach you, we will leave a message.  We will attempt to reach you two times.  During this call, we will ask if you have developed any symptoms of COVID 19. If you develop any symptoms (ie: fever, flu-like symptoms, shortness of breath, cough etc.) before then, please call [phone].  If you test positive for Covid 19 in the 2 weeks post procedure, please call and report this information to [place].    If any biopsies were taken you will be contacted by phone or by letter within the next 1-3 weeks.  Please call us at [phone] if you have not heard about the biopsies in 3 weeks.    SIGNATURES/CONFIDENTIALITY: You and/or your care partner have signed paperwork which will be entered into your electronic medical record.  These signatures attest to the fact that that the information above on your After Visit Summary has been reviewed and is understood.  Full responsibility of the confidentiality of this discharge information lies with you and/or your care-partner.

## 2020-11-07 NOTE — Progress Notes (Signed)
History and Physical:  This patient presents for endoscopic testing for: Encounter Diagnosis  Name Primary?   Special screening for malignant neoplasms, colon Yes    Denies chronic abd pain, rectal bleeding or altered BMs  ROS: Patient denies chest pain or cough   Past Medical History: Past Medical History:  Diagnosis Date   Cervical high risk human papillomavirus (HPV) DNA test positive 2012, 2013   Genital herpes    Irregular menses    LGSIL on Pap smear of cervix 2012, 2013   Obesity      Past Surgical History: Past Surgical History:  Procedure Laterality Date   COLPOSCOPY  2012   TONSILLECTOMY      Allergies: Allergies  Allergen Reactions   Tramadol Hives   Amoxicillin Hives    Outpatient Meds: Current Outpatient Medications  Medication Sig Dispense Refill   Ascorbic Acid (VITAMIN C) 1000 MG tablet Take 1,000 mg by mouth daily.     Nutritional Supplements (VITAMIN D BOOSTER PO) Take by mouth.     ibuprofen (ADVIL) 800 MG tablet Take 1 tablet (800 mg total) by mouth every 8 (eight) hours as needed. 21 tablet 0   Current Facility-Administered Medications  Medication Dose Route Frequency Provider Last Rate Last Admin   0.9 %  sodium chloride infusion  500 mL Intravenous Once Doran Stabler, MD          ___________________________________________________________________ Objective   Exam:  BP (!) 150/83   Pulse 77   Temp 98.4 F (36.9 C)   Resp 13   Ht 5\' 4"  (1.626 m)   Wt 243 lb (110.2 kg)   SpO2 100%   BMI 41.71 kg/m   CV: RRR without murmur, S1/S2 Resp: clear to auscultation bilaterally, normal RR and effort noted IZ:TIWPY, soft, no tenderness, with active bowel sounds.   Assessment: Encounter Diagnosis  Name Primary?   Special screening for malignant neoplasms, colon Yes     Plan: Colonoscopy  The benefits and risks of the planned procedure were described in detail with the patient or (when appropriate) their health care proxy.   Risks were outlined as including, but not limited to, bleeding, infection, perforation, adverse medication reaction leading to cardiac or pulmonary decompensation, pancreatitis (if ERCP).  The limitation of incomplete mucosal visualization was also discussed.  No guarantees or warranties were given.    The patient is appropriate for an endoscopic procedure in the ambulatory setting.   - Wilfrid Lund, MD

## 2020-11-07 NOTE — Progress Notes (Signed)
Called to room to assist during endoscopic procedure.  Patient ID and intended procedure confirmed with present staff. Received instructions for my participation in the procedure from the performing physician.  

## 2020-11-09 ENCOUNTER — Telehealth: Payer: Self-pay

## 2020-11-09 NOTE — Telephone Encounter (Signed)
  Follow up Call-  Call back number 11/07/2020  Post procedure Call Back phone  # [phone]  Permission to leave phone message Yes  Some recent data might be hidden     Patient questions:  Do you have a fever, pain , or abdominal swelling? No. Pain Score  0 *  Have you tolerated food without any problems? Yes.    Have you been able to return to your normal activities? Yes.    Do you have any questions about your discharge instructions: Diet   No. Medications  No. Follow up visit  No.  Do you have questions or concerns about your Care? No.  Actions: * If pain score is 4 or above: No action needed, pain <4.   Have you developed a fever since your procedure? no  2.   Have you had an respiratory symptoms (SOB or cough) since your procedure? no  3.   Have you tested positive for COVID 19 since your procedure no  4.   Have you had any family members/close contacts diagnosed with the COVID 19 since your procedure?  no   If yes to any of these questions please route to Joylene John, RN and Joella Prince, RN

## 2020-11-13 ENCOUNTER — Encounter: Payer: Self-pay | Admitting: Gastroenterology

## 2020-12-22 ENCOUNTER — Ambulatory Visit (INDEPENDENT_AMBULATORY_CARE_PROVIDER_SITE_OTHER): Payer: No Typology Code available for payment source | Admitting: Internal Medicine

## 2020-12-22 ENCOUNTER — Encounter: Payer: Self-pay | Admitting: Internal Medicine

## 2020-12-22 ENCOUNTER — Other Ambulatory Visit: Payer: Self-pay

## 2020-12-22 VITALS — BP 118/80 | HR 67 | Resp 18 | Ht 64.0 in | Wt 245.4 lb

## 2020-12-22 DIAGNOSIS — Z0000 Encounter for general adult medical examination without abnormal findings: Secondary | ICD-10-CM

## 2020-12-22 LAB — COMPREHENSIVE METABOLIC PANEL
ALT: 13 U/L (ref 0–35)
AST: 17 U/L (ref 0–37)
Albumin: 4 g/dL (ref 3.5–5.2)
Alkaline Phosphatase: 56 U/L (ref 39–117)
BUN: 9 mg/dL (ref 6–23)
CO2: 25 mEq/L (ref 19–32)
Calcium: 9.6 mg/dL (ref 8.4–10.5)
Chloride: 103 mEq/L (ref 96–112)
Creatinine, Ser: 0.68 mg/dL (ref 0.40–1.20)
GFR: 105.12 mL/min (ref >60.00)
Glucose, Bld: 90 mg/dL (ref 70–99)
Potassium: 4.4 mEq/L (ref 3.5–5.1)
Sodium: 134 mEq/L — ABNORMAL LOW (ref 135–145)
Total Bilirubin: 0.4 mg/dL (ref 0.2–1.2)
Total Protein: 7.8 g/dL (ref 6.0–8.3)

## 2020-12-22 LAB — LIPID PANEL
Cholesterol: 175 mg/dL (ref 0–200)
HDL: 54.6 mg/dL (ref >39.00)
LDL Cholesterol: 108 mg/dL — ABNORMAL HIGH (ref 0–99)
NonHDL: 120.17
Total CHOL/HDL Ratio: 3
Triglycerides: 59 mg/dL (ref 0.0–149.0)
VLDL: 11.8 mg/dL (ref 0.0–40.0)

## 2020-12-22 LAB — CBC
HCT: 31.5 % — ABNORMAL LOW (ref 36.0–46.0)
Hemoglobin: 9.5 g/dL — ABNORMAL LOW (ref 12.0–15.0)
MCHC: 30.3 g/dL (ref 30.0–36.0)
MCV: 74.5 fl — ABNORMAL LOW (ref 78.0–100.0)
Platelets: 550 10*3/uL — ABNORMAL HIGH (ref 150.0–400.0)
RBC: 4.23 Mil/uL (ref 3.87–5.11)
RDW: 17.6 % — ABNORMAL HIGH (ref 11.5–15.5)
WBC: 5.9 10*3/uL (ref 4.0–10.5)

## 2020-12-22 LAB — HEMOGLOBIN A1C: Hgb A1c MFr Bld: 6 % (ref 4.6–6.5)

## 2020-12-22 NOTE — Patient Instructions (Signed)
We will check the labs and let you know about the results.

## 2020-12-22 NOTE — Assessment & Plan Note (Signed)
BMI 42 checking for complications with labs today.

## 2020-12-22 NOTE — Assessment & Plan Note (Signed)
Flu shot declines. Covid-19 booster encouraged. Tetanus declines. Colonoscopy up to date. Mammogram up to date, pap smear up to date. Counseled about sun safety and mole surveillance. Counseled about the dangers of distracted driving. Given 10 year screening recommendations.

## 2020-12-22 NOTE — Progress Notes (Signed)
   Subjective:   Patient ID: Carmen Sosa, female    DOB: 1975/03/28, 45 y.o.   MRN: [id number]  HPI The patient is a new 45 YO female coming in for physical.   PMH, [place], social history reviewed and updated  Review of Systems  Constitutional: Negative.   HENT: Negative.    Eyes: Negative.   Respiratory:  Negative for cough, chest tightness and shortness of breath.   Cardiovascular:  Negative for chest pain, palpitations and leg swelling.  Gastrointestinal:  Negative for abdominal distention, abdominal pain, constipation, diarrhea, nausea and vomiting.  Musculoskeletal: Negative.   Skin: Negative.   Neurological: Negative.   Psychiatric/Behavioral: Negative.     Objective:  Physical Exam Constitutional:      Appearance: She is well-developed.  HENT:     Head: Normocephalic and atraumatic.  Cardiovascular:     Rate and Rhythm: Normal rate and regular rhythm.  Pulmonary:     Effort: Pulmonary effort is normal. No respiratory distress.     Breath sounds: Normal breath sounds. No wheezing or rales.  Abdominal:     General: Bowel sounds are normal. There is no distension.     Palpations: Abdomen is soft.     Tenderness: There is no abdominal tenderness. There is no rebound.  Musculoskeletal:     Cervical back: Normal range of motion.  Skin:    General: Skin is warm and dry.  Neurological:     Mental Status: She is alert and oriented to person, place, and time.     Coordination: Coordination normal.    Vitals:   12/22/20 0957  BP: 118/80  Pulse: 67  Resp: 18  SpO2: 100%  Weight: 245 lb 6.4 oz (111.3 kg)  Height: 5\' 4"  (1.626 m)    This visit occurred during the SARS-CoV-2 public health emergency.  Safety protocols were in place, including screening questions prior to the visit, additional usage of staff PPE, and extensive cleaning of exam room while observing appropriate contact time as indicated for disinfecting solutions.   Assessment & Plan:

## 2021-10-01 ENCOUNTER — Encounter (HOSPITAL_COMMUNITY): Payer: Self-pay

## 2021-10-01 ENCOUNTER — Emergency Department (HOSPITAL_COMMUNITY)
Admission: EM | Admit: 2021-10-01 | Discharge: 2021-10-01 | Disposition: A | Discharge status: Home / Self Care | Payer: Commercial Managed Care - HMO | Attending: Emergency Medicine | Admitting: Emergency Medicine

## 2021-10-01 ENCOUNTER — Emergency Department (HOSPITAL_BASED_OUTPATIENT_CLINIC_OR_DEPARTMENT_OTHER): Payer: Commercial Managed Care - HMO

## 2021-10-01 ENCOUNTER — Emergency Department (HOSPITAL_COMMUNITY): Payer: Commercial Managed Care - HMO

## 2021-10-01 ENCOUNTER — Other Ambulatory Visit: Payer: Self-pay

## 2021-10-01 DIAGNOSIS — R110 Nausea: Secondary | ICD-10-CM | POA: Insufficient documentation

## 2021-10-01 DIAGNOSIS — R600 Localized edema: Secondary | ICD-10-CM | POA: Insufficient documentation

## 2021-10-01 DIAGNOSIS — R072 Precordial pain: Secondary | ICD-10-CM | POA: Diagnosis present

## 2021-10-01 DIAGNOSIS — M7989 Other specified soft tissue disorders: Secondary | ICD-10-CM

## 2021-10-01 LAB — CBC
HCT: 37.1 % (ref 36.0–46.0)
Hemoglobin: 11.7 g/dL — ABNORMAL LOW (ref 12.0–15.0)
MCH: 26.1 pg (ref 26.0–34.0)
MCHC: 31.5 g/dL (ref 30.0–36.0)
MCV: 82.8 fL (ref 80.0–100.0)
Platelets: 441 10*3/uL — ABNORMAL HIGH (ref 150–400)
RBC: 4.48 MIL/uL (ref 3.87–5.11)
RDW: 18.4 % — ABNORMAL HIGH (ref 11.5–15.5)
WBC: 6.2 10*3/uL (ref 4.0–10.5)
nRBC: 0 % (ref 0.0–0.2)

## 2021-10-01 LAB — BASIC METABOLIC PANEL
Anion gap: 8 (ref 5–15)
BUN: 13 mg/dL (ref 6–20)
CO2: 23 mmol/L (ref 22–32)
Calcium: 9.6 mg/dL (ref 8.9–10.3)
Chloride: 105 mmol/L (ref 98–111)
Creatinine, Ser: 0.76 mg/dL (ref 0.44–1.00)
GFR, Estimated: >60 mL/min (ref >60)
Glucose, Bld: 102 mg/dL — ABNORMAL HIGH (ref 70–99)
Potassium: 4 mmol/L (ref 3.5–5.1)
Sodium: 136 mmol/L (ref 135–145)

## 2021-10-01 LAB — D-DIMER, QUANTITATIVE: D-Dimer, Quant: 0.48 ug/mL-FEU (ref 0.00–0.50)

## 2021-10-01 LAB — TROPONIN I (HIGH SENSITIVITY)
Troponin I (High Sensitivity): 3 ng/L (ref <18)
Troponin I (High Sensitivity): 3 ng/L (ref <18)

## 2021-10-01 LAB — MAGNESIUM: Magnesium: 1.9 mg/dL (ref 1.7–2.4)

## 2021-10-01 NOTE — ED Triage Notes (Signed)
Patient reports chest pain that started around 10am with pain radiating to jaw.  Denies sob, n/v

## 2021-10-01 NOTE — Discharge Instructions (Addendum)
Please read and follow all provided instructions.  Your diagnoses today include:  1. Precordial pain     Tests performed today include: An EKG of your heart A chest x-ray Cardiac enzymes - a blood test for heart muscle damage Blood counts and electrolytes Vital signs. See below for your results today.   Medications prescribed:  None  Take any prescribed medications only as directed.  Follow-up instructions: Please follow-up with your primary care provider as soon as you can for further evaluation of your symptoms.   Return instructions:  SEEK IMMEDIATE MEDICAL ATTENTION IF: You have severe chest pain, especially if the pain is crushing or pressure-like and spreads to the arms, back, neck, or jaw, or if you have sweating, nausea or vomiting, or trouble with breathing. THIS IS AN EMERGENCY. Do not wait to see if the pain will go away. Get medical help at once. Call 911. DO NOT drive yourself to the hospital.  Your chest pain gets worse and does not go away after a few minutes of rest.  You have an attack of chest pain lasting longer than what you usually experience.  You have significant dizziness, if you pass out, or have trouble walking.  You have chest pain not typical of your usual pain for which you originally saw your caregiver.  You have any other emergent concerns regarding your health.  Additional Information: Chest pain comes from many different causes. Your caregiver has diagnosed you as having chest pain that is not specific for one problem, but does not require admission.  You are at low risk for an acute heart condition or other serious illness.   Your vital signs today were: BP (!) 105/57   Pulse 62   Temp 97.7 F (36.5 C) (Oral)   Resp 20   Ht '5\' 4"'$  (1.626 m)   Wt 111.1 kg   SpO2 99%   BMI 42.05 kg/m  If your blood pressure (BP) was elevated above 135/85 this visit, please have this repeated by your doctor within one month. --------------

## 2021-10-01 NOTE — ED Provider Triage Note (Signed)
Emergency Medicine Provider Triage Evaluation Note  Carmen Sosa , a 46 y.o. female  was evaluated in triage.  Pt complains of chest pain.  Symptoms started while driving at 10 AM.  Pain is mid chest and radiates to the jaw.  No diaphoresis.  She had nausea but no vomiting.  No significant shortness of breath.  Denies past medical history.  Denies drugs or alcohol.  Review of Systems  Positive: Chest pain Negative: Fever Physical Exam  BP (!) 158/83 (BP Location: Left Arm)   Pulse 83   Temp 97.7 F (36.5 C) (Oral)   Resp 18   SpO2 98%  Gen:   Awake, no distress   Resp:  Normal effort  MSK:   Moves extremities without difficulty  Other:    Medical Decision Making  Medically screening exam initiated at 10:35 AM.  Appropriate orders placed.  Carmen Sosa was informed that the remainder of the evaluation will be completed by another provider, this initial triage assessment does not replace that evaluation, and the importance of remaining in the ED until their evaluation is complete.     Carlisle Cater, PA-C 10/01/21 1036

## 2021-10-01 NOTE — ED Provider Notes (Signed)
[facility] EMERGENCY DEPARTMENT Provider Note   CSN: [id number] Arrival date & time: 10/01/21  1027     History  Chief Complaint  Patient presents with   Chest Pain    Carmen Sosa is a 46 y.o. female.  Patient seen by myself on arrival in triage.  She is here for evaluation of chest pain.  Her symptoms started while driving at 10 AM.  Pain is mid chest and radiates to the jaw and left shoulder.  No diaphoresis.  She had nausea but no vomiting.  No significant shortness of breath.  Denies past medical history including hypertension, diabetes, high cholesterol, smoking.  Denies drugs/stimulant use or alcohol.  She has had some intermittent left lower extremity swelling and pain.  She cannot tell me exactly when this started, but again more recently.  Otherwise patient denies risk factors for DVT/pulmonary embolism including: history of DVT/PE/other blood clots, use of exogenous hormones, recent immobilizations, recent surgery, recent travel (>4hr segment), malignancy, hemoptysis.         Home Medications Prior to Admission medications   Medication Sig Start Date End Date Taking? Authorizing Provider  Ascorbic Acid (VITAMIN C) 1000 MG tablet Take 1,000 mg by mouth daily.    [provider]  ibuprofen (ADVIL) 800 MG tablet Take 1 tablet (800 mg total) by mouth every 8 (eight) hours as needed. 09/09/18   Lawyer, Harrell Gave, PA-C  Nutritional Supplements (VITAMIN D BOOSTER PO) Take by mouth.    [provider]  tiZANidine (ZANAFLEX) 4 MG tablet Take 4 mg by mouth as needed for muscle spasms.    [provider]  valACYclovir (VALTREX) 500 MG tablet Take 500 mg by mouth as needed.    [provider]      Allergies    Tramadol and Amoxicillin    Review of Systems   Review of Systems  Physical Exam Updated Vital Signs BP (!) 115/50   Pulse 62   Temp 97.7 F (36.5 C) (Oral)   Resp 17   Ht '5\' 4"'$  (1.626 m)   Wt 111.1 kg    SpO2 100%   BMI 42.05 kg/m  Physical Exam Vitals and nursing note reviewed.  Constitutional:      Appearance: She is well-developed. She is not diaphoretic.  HENT:     Head: Normocephalic and atraumatic.     Mouth/Throat:     Mouth: Mucous membranes are not dry.  Eyes:     Conjunctiva/sclera: Conjunctivae normal.  Neck:     Vascular: Normal carotid pulses. No JVD.     Trachea: Trachea normal. No tracheal deviation.  Cardiovascular:     Rate and Rhythm: Normal rate and regular rhythm.     Pulses: No decreased pulses.          Radial pulses are 2+ on the right side and 2+ on the left side.     Heart sounds: Normal heart sounds, S1 normal and S2 normal. No murmur heard. Pulmonary:     Effort: Pulmonary effort is normal. No respiratory distress.     Breath sounds: No wheezing.  Chest:     Chest wall: No tenderness.  Abdominal:     General: Bowel sounds are normal.     Palpations: Abdomen is soft.     Tenderness: There is no abdominal tenderness. There is no guarding or rebound.  Musculoskeletal:        General: Normal range of motion.     Cervical back: Normal range  of motion and neck supple. No muscular tenderness.     Left lower leg: Edema present.     Comments: Mild left calf swelling when compared to the right.  Skin:    General: Skin is warm and dry.     Coloration: Skin is not pale.  Neurological:     Mental Status: She is alert.    ED Results / Procedures / Treatments   Labs (all labs ordered are listed, but only abnormal results are displayed) Labs Reviewed  BASIC METABOLIC PANEL - Abnormal; Notable for the following components:      Result Value   Glucose, Bld 102 (*)    All other components within normal limits  CBC - Abnormal; Notable for the following components:   Hemoglobin 11.7 (*)    RDW 18.4 (*)    Platelets 441 (*)    All other components within normal limits  MAGNESIUM  D-DIMER, QUANTITATIVE  TROPONIN I (HIGH SENSITIVITY)  TROPONIN I (HIGH  SENSITIVITY)    ED ECG REPORT   Date: 10/01/2021  Rate: 83  Rhythm: normal sinus rhythm  QRS Axis: normal  Intervals: normal  ST/T Wave abnormalities: nonspecific T wave changes  Conduction Disutrbances:none  Narrative Interpretation:   Old EKG Reviewed: changes noted, t-wave abnormality noted today  I have personally reviewed the EKG tracing and agree with the computerized printout as noted.   Radiology DG Chest 2 View  Result Date: 10/01/2021 CLINICAL DATA:  Chest pain EXAM: CHEST - 2 VIEW COMPARISON:  01/28/2014 FINDINGS: Heart size is normal. Mediastinal shadows are normal. The lungs are clear. No bronchial thickening. No infiltrate, mass, effusion or collapse. Pulmonary vascularity is normal. No bony abnormality. IMPRESSION: Normal chest Electronically Signed   By: Nelson Chimes M.D.   On: 10/01/2021 11:02    Procedures Procedures    Medications Ordered in ED Medications - No data to display  ED Course/ Medical Decision Making/ A&P Clinical Course as of 10/01/21 1311  Mon Oct 01, 2021  1131 DG Chest 2 View Normal chest [HN]  7782 Basic metabolic panel(!) unremarkable [HN]  1212 CBC(!) Very slightly anemic, otherwise unremarkable [HN]  1212 Troponin I (High Sensitivity): 3 [HN]    Clinical Course User Index [HN] Audley Hose, MD   Patient seen and examined. History obtained directly from patient. Work-up including labs, imaging, EKG ordered in triage, if performed, were reviewed.    Labs/EKG: Independently reviewed and interpreted.  This included: BMP unremarkable; CBC mild anemia 11.7 otherwise unremarkable; first troponin negative at 3.  EKG personally reviewed and interpreted.  Added D-dimer to evaluate for possibility of thrombosis given intermittent left lower extremity swelling and new chest pain.  Imaging: Independently visualized and interpreted.  This included: Chest x-ray agree negative.   Added left lower extremity DVT study due to intermittent  calf pain and swelling.  Medications/Fluids: None ordered  Most recent vital signs reviewed and are as follows: BP (!) 115/50   Pulse 62   Temp 97.7 F (36.5 C) (Oral)   Resp 17   Ht '5\' 4"'$  (1.626 m)   Wt 111.1 kg   SpO2 100%   BMI 42.05 kg/m   Initial impression: Chest pain  3:06 PM   Labs personally reviewed and interpreted including: D-dimer normal, second troponin stable at 3.  Currently getting lower extremity Doppler.  Plan: Pending reassessment.   3:50 PM Reassessment performed. Patient appears stable.  Imaging results reviewed including: DVT study read as negative.  Patient discussed with  and seen by Dr. Mayra Neer. Reviewed pertinent lab work and imaging with patient at bedside. Questions answered.   Most current vital signs reviewed and are as follows: BP (!) 105/57   Pulse 62   Temp 97.7 F (36.5 C) (Oral)   Resp 20   Ht '5\' 4"'$  (1.626 m)   Wt 111.1 kg   SpO2 99%   BMI 42.05 kg/m   Plan: Discharge to home.   Prescriptions written for: None  Return and follow-up instructions: I encouraged patient to return to ED with severe chest pain, especially if the pain is crushing or pressure-like and spreads to the arms, back, neck, or jaw, or if they have associated sweating, vomiting, or shortness of breath with the pain, or significant pain with activity. We discussed that the evaluation here today indicates a low-risk of serious cause of chest pain, including heart trouble or a blood clot, but no evaluation is perfect and chest pain can evolve with time. The patient verbalized understanding and agreed.  I encouraged patient to follow-up with their provider in the next 48 hours for recheck.                            Medical Decision Making Amount and/or Complexity of Data Reviewed Labs: ordered. Decision-making details documented in ED Course. Radiology: ordered. Decision-making details documented in ED Course.   For this patient's complaint of chest pain, the  following emergent conditions were considered on the differential diagnosis: acute coronary syndrome, pulmonary embolism, pneumothorax, myocarditis, pericardial tamponade, aortic dissection, thoracic aortic aneurysm complication, esophageal perforation.   Other causes were also considered including: gastroesophageal reflux disease, musculoskeletal pain including costochondritis, pneumonia/pleurisy, herpes zoster, pericarditis.  In regards to possibility of ACS, patient has atypical features of pain, non-ischemic and unchanged EKG and negative troponin(s). Heart score was calculated to be 2.   In regards to possibility of PE, symptoms are atypical for PE and risk profile is low, d-dimer negative in patient with low-risk [place], neg left LE DVT.   The patient's vital signs, pertinent lab work and imaging were reviewed and interpreted as discussed in the ED course. Hospitalization was considered for further testing, treatments, or serial exams/observation. However as patient is well-appearing, has a stable exam, and reassuring studies today, I do not feel that they warrant admission at this time. This plan was discussed with the patient who verbalizes agreement and comfort with this plan and seems reliable and able to return to the Emergency Department with worsening or changing symptoms.          Final Clinical Impression(s) / ED Diagnoses Final diagnoses:  Precordial pain    Rx / DC Orders ED Discharge Orders     None         Carlisle Cater, PA-C 10/01/21 1558    Audley Hose, MD 10/02/21 [phone]

## 2021-10-01 NOTE — Progress Notes (Signed)
Lower extremity venous left .study completed.  Preliminary results relayed to [place], [place].  See CV Proc for preliminary results report.   Darlin Coco, RDMS, RVT

## 2021-10-08 ENCOUNTER — Ambulatory Visit: Payer: Commercial Managed Care - HMO | Admitting: Internal Medicine

## 2021-10-08 ENCOUNTER — Encounter: Payer: Self-pay | Admitting: Internal Medicine

## 2021-10-08 DIAGNOSIS — R0789 Other chest pain: Secondary | ICD-10-CM | POA: Diagnosis not present

## 2021-10-08 NOTE — Patient Instructions (Signed)
We will try ibuprofen 600 mg twice a day for 3-4 days to see if this helps. If you are still having it let us know.

## 2021-10-08 NOTE — Progress Notes (Unsigned)
   Subjective:   Patient ID: Carmen Sosa, female    DOB: [birth year], 46 y.o.   MRN: [id number]  HPI The patient is a 46 YO female coming in for follow up ER visit chest pains. Still having them off and on with chest pressure and symptoms in left arm and jaw.  Review of Systems  Constitutional: Negative.   HENT: Negative.    Eyes: Negative.   Respiratory:  Positive for chest tightness. Negative for cough and shortness of breath.   Cardiovascular:  Positive for chest pain. Negative for palpitations and leg swelling.  Gastrointestinal:  Negative for abdominal distention, abdominal pain, constipation, diarrhea, nausea and vomiting.  Musculoskeletal: Negative.   Skin: Negative.   Neurological: Negative.   Psychiatric/Behavioral: Negative.      Objective:  Physical Exam Constitutional:      Appearance: She is well-developed. She is obese.  HENT:     Head: Normocephalic and atraumatic.  Cardiovascular:     Rate and Rhythm: Normal rate and regular rhythm.  Pulmonary:     Effort: Pulmonary effort is normal. No respiratory distress.     Breath sounds: Normal breath sounds. No wheezing or rales.  Abdominal:     General: Bowel sounds are normal. There is no distension.     Palpations: Abdomen is soft.     Tenderness: There is no abdominal tenderness. There is no rebound.  Musculoskeletal:     Cervical back: Normal range of motion.  Skin:    General: Skin is warm and dry.  Neurological:     Mental Status: She is alert and oriented to person, place, and time.     Coordination: Coordination normal.     Vitals:   10/08/21 0816  BP: 124/86  Pulse: 76  SpO2: 96%  Weight: 242 lb (109.8 kg)  Height: 5' 3.5" (1.613 m)    Assessment & Plan:

## 2021-10-10 DIAGNOSIS — R0789 Other chest pain: Secondary | ICD-10-CM | POA: Insufficient documentation

## 2021-10-10 NOTE — Assessment & Plan Note (Signed)
Could be muscle/tendon related. Advised to take ibuprofen 600 mg BID scheduled for 3-4 days and let us know if resolving. If not resolving needs cardiology referral for stress testing or non-invasive imaging.

## 2021-10-22 NOTE — Progress Notes (Unsigned)
PCP:  Hoyt Koch, MD   No chief complaint on file.    HPI:      [name] is a 46 y.o. G2P0011 who LMP was No LMP recorded., presents today for her annual examination.  Her menses are usually regular every 28-30 days (June period was a couple wks late), lasting 4-6 days, heavy flow, changing products every 30 min for 2 days, with quarter sized flots. Flow is heavier this yr, changing more like every 1+ hrs last yr.   Dysmenorrhea mild. She does not have intermenstrual bleeding. Not taking Fe supp. Has been having vasomotor sx for the past few months. No increased stress, no recent thyroid labs. Hx of IDD last yr on CBC  Sex activity: single partner, contraception - none. No new partners. Tried OCPs in past but had increased facial hair so stopped them. Interested in [facility], doesn't want pills or depo. Hasn't done IUD.   Last Pap: 09/27/19 Results were normal/neg HPV DNA.  Hx of STDs: HPV/LGSIL on pap 2012/2013; HSV, takes valtrex prn. Needs Rx RF to [facility]  Last mammogram: 07/31/20 Results were normal, repeat in 12 months There is no FH of breast cancer. There is no FH of ovarian cancer. The patient does not do self-breast exams.  Tobacco use: The patient denies current or previous tobacco use. Alcohol use: social drinker No drug use.  Exercise: moderately active  Colonoscopy: 10/22 at [facility], repeat due after 10 yrs  She does get adequate calcium and Vitamin D in her diet. Labs at work. No hx of pre-DM.   Had elevated Bps at home last yr that resolved. BP usually 135/80 at home per pt.   Past Medical History:  Diagnosis Date   Cervical high risk human papillomavirus (HPV) DNA test positive 2012, 2013   Genital herpes    Irregular menses    LGSIL on Pap smear of cervix 2012, 2013   Obesity     Past Surgical History:  Procedure Laterality Date   COLPOSCOPY  2012   TONSILLECTOMY      Family History  Problem Relation Age of Onset   Diabetes Father     Breast cancer Neg Hx    Ovarian cancer Neg Hx    Colon cancer Neg Hx    Colon polyps Neg Hx    Heart disease Neg Hx    Stomach cancer Neg Hx    Rectal cancer Neg Hx     Social History   Socioeconomic History   Marital status: Divorced    Spouse name: Not on file   Number of children: Not on file   Years of education: Not on file   Highest education level: Not on file  Occupational History   Not on file  Tobacco Use   Smoking status: Never   Smokeless tobacco: Never  Vaping Use   Vaping Use: Never used  Substance and Sexual Activity   Alcohol use: Yes    Comment: occasioanlly   Drug use: No   Sexual activity: Yes    Birth control/protection: None  Other Topics Concern   Not on file  Social History Narrative   Not on file   Social Determinants of Health   Financial Resource Strain: Not on file  Food Insecurity: Not on file  Transportation Needs: Not on file  Physical Activity: Not on file  Stress: Not on file  Social Connections: Not on file  Intimate Partner Violence: Not on file  Outpatient Medications Prior to Visit  Medication Sig Dispense Refill   Ascorbic Acid (VITAMIN C) 1000 MG tablet Take 1,000 mg by mouth daily.     cholecalciferol (VITAMIN D3) 25 MCG (1000 UNIT) tablet Take 1,000 Units by mouth daily.     ferrous sulfate (SM IRON) 325 (65 FE) MG tablet Take 325 mg by mouth daily with breakfast.     ibuprofen (ADVIL) 800 MG tablet Take 1 tablet (800 mg total) by mouth every 8 (eight) hours as needed. 21 tablet 0   Nutritional Supplements (VITAMIN D BOOSTER PO) Take by mouth.     tiZANidine (ZANAFLEX) 4 MG tablet Take 4 mg by mouth as needed for muscle spasms.     valACYclovir (VALTREX) 500 MG tablet Take 500 mg by mouth as needed.     No facility-administered medications prior to visit.    ROS:  Review of Systems  Constitutional:  Negative for fatigue, fever and unexpected weight change.  Respiratory:  Negative for cough, shortness of breath  and wheezing.   Cardiovascular:  Negative for chest pain, palpitations and leg swelling.  Gastrointestinal:  Negative for blood in stool, constipation, diarrhea, nausea and vomiting.  Endocrine: Negative for cold intolerance, heat intolerance and polyuria.  Genitourinary:  Negative for dyspareunia, dysuria, flank pain, frequency, genital sores, hematuria, menstrual problem, pelvic pain, urgency, vaginal bleeding, vaginal discharge and vaginal pain.  Musculoskeletal:  Negative for back pain, joint swelling and myalgias.  Skin:  Negative for rash.  Neurological:  Negative for dizziness, syncope, light-headedness, numbness and headaches.  Hematological:  Negative for adenopathy.  Psychiatric/Behavioral:  Negative for agitation, confusion, sleep disturbance and suicidal ideas. The patient is not nervous/anxious.   BREAST: No symptoms   Objective: There were no vitals taken for this visit. BP 152/98 initially  Physical Exam Constitutional:      Appearance: She is well-developed.  Genitourinary:     Vulva normal.     Right Labia: No rash, tenderness or lesions.    Left Labia: No tenderness, lesions or rash.    No vaginal discharge, erythema or tenderness.      Right Adnexa: not tender and no mass present.    Left Adnexa: not tender and no mass present.    No cervical friability or polyp.     Uterus is not enlarged or tender.  Breasts:    Right: No mass, nipple discharge, skin change or tenderness.     Left: No mass, nipple discharge, skin change or tenderness.  Neck:     Thyroid: No thyromegaly.  Cardiovascular:     Rate and Rhythm: Normal rate and regular rhythm.     Heart sounds: Normal heart sounds. No murmur heard. Pulmonary:     Effort: Pulmonary effort is normal.     Breath sounds: Normal breath sounds.  Abdominal:     Palpations: Abdomen is soft.     Tenderness: There is no abdominal tenderness. There is no guarding or rebound.  Musculoskeletal:        General: Normal  range of motion.     Cervical back: Normal range of motion.  Lymphadenopathy:     Cervical: No cervical adenopathy.  Neurological:     General: No focal deficit present.     Mental Status: She is alert and oriented to person, place, and time.     Cranial Nerves: No cranial nerve deficit.  Skin:    General: Skin is warm and dry.  Psychiatric:        Mood  and Affect: Mood normal.        Behavior: Behavior normal.        Thought Content: Thought content normal.        Judgment: Judgment normal.  Vitals reviewed.    Assessment/Plan: Encounter for annual routine gynecological examination  Encounter for initial prescription of intrauterine contraceptive device (IUD); IUD pros/cons/procedure discussed. Pt to research Mirena and f/u prn questions/with menses for insertion.   Encounter for screening mammogram for malignant neoplasm of breast--pt current on mammo  Screening for colon cancer - Plan: Ambulatory referral to Gastroenterology; refer to GI for scr colonoscopy due to age  Menorrhagia with regular cycle - Plan: CBC with Differential/Platelet; worsening sx. Check labs. Discussed IUD for mgmt. Pt to f/u for insertion if desires, or can discuss other tx options. Not taking Fe supp.   Screening for deficiency anemia - Plan: CBC with Differential/Platelet  Perimenopausal vasomotor symptoms - Plan: TSH + free T4; check labs. If WNL, most likely perimenopausal.   Thyroid disorder screening - Plan: TSH + free T4  Herpes simplex vulvovaginitis - Plan: valACYclovir (VALTREX) 500 MG tablet; Rx RF valtrex prn to [facility]  Elevated blood pressure reading in office without diagnosis of hypertension; BP improved with repeat, WNL at home. Pt to cont to keep check.    No orders of the defined types were placed in this encounter.             GYN counsel breast self exam, mammography screening, adequate intake of calcium and vitamin D, diet and exercise     F/U  No follow-ups on  file.  Jarelis Ehlert B. Oluwaseun Bruyere, PA-C 10/22/2021 1:04 PM

## 2021-10-23 ENCOUNTER — Encounter: Payer: Self-pay | Admitting: Obstetrics and Gynecology

## 2021-10-23 ENCOUNTER — Ambulatory Visit (INDEPENDENT_AMBULATORY_CARE_PROVIDER_SITE_OTHER): Payer: Commercial Managed Care - HMO | Admitting: Obstetrics and Gynecology

## 2021-10-23 VITALS — BP 138/90 | Ht 63.5 in | Wt 247.0 lb

## 2021-10-23 DIAGNOSIS — Z01411 Encounter for gynecological examination (general) (routine) with abnormal findings: Secondary | ICD-10-CM | POA: Diagnosis not present

## 2021-10-23 DIAGNOSIS — N920 Excessive and frequent menstruation with regular cycle: Secondary | ICD-10-CM

## 2021-10-23 DIAGNOSIS — Z1231 Encounter for screening mammogram for malignant neoplasm of breast: Secondary | ICD-10-CM

## 2021-10-23 DIAGNOSIS — A6004 Herpesviral vulvovaginitis: Secondary | ICD-10-CM | POA: Diagnosis not present

## 2021-10-23 DIAGNOSIS — Z01419 Encounter for gynecological examination (general) (routine) without abnormal findings: Secondary | ICD-10-CM

## 2021-10-23 NOTE — Patient Instructions (Addendum)
I value your feedback and you entrusting us with your care. If you get a [facility] patient survey, I would appreciate you taking the time to let us know about your experience today. Thank you!  [facility]: [phone]      

## 2022-04-25 IMAGING — MG MM DIGITAL SCREENING BILAT W/ TOMO AND CAD
6 of 10 series · 6 of 30 positions shown · non-contrast
Comparison: Previous exam(s).

CLINICAL DATA: Screening.

EXAM:
DIGITAL SCREENING BILATERAL MAMMOGRAM WITH TOMOSYNTHESIS AND CAD
TECHNIQUE: Bilateral screening digital craniocaudal and mediolateral oblique
mammograms were obtained. Bilateral screening digital breast
tomosynthesis was performed. The images were evaluated with
computer-aided detection.

[L MLO synth-2D]
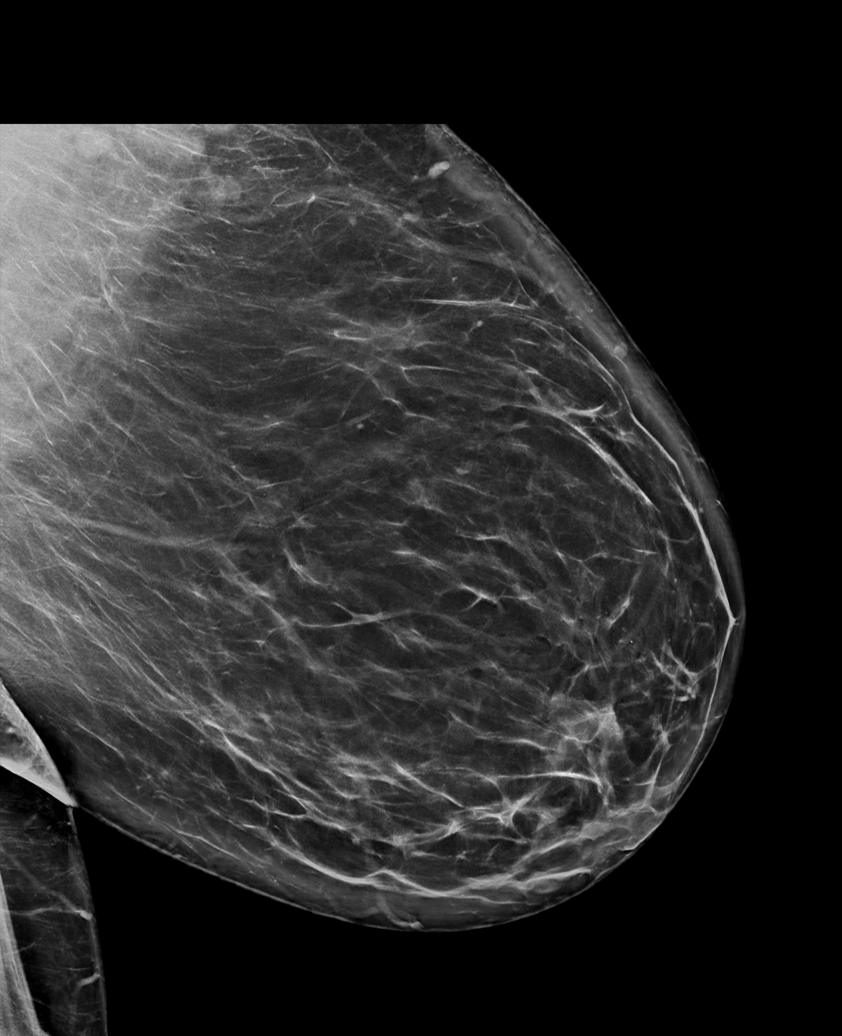

[R CC synth-2D (1 of 2)]
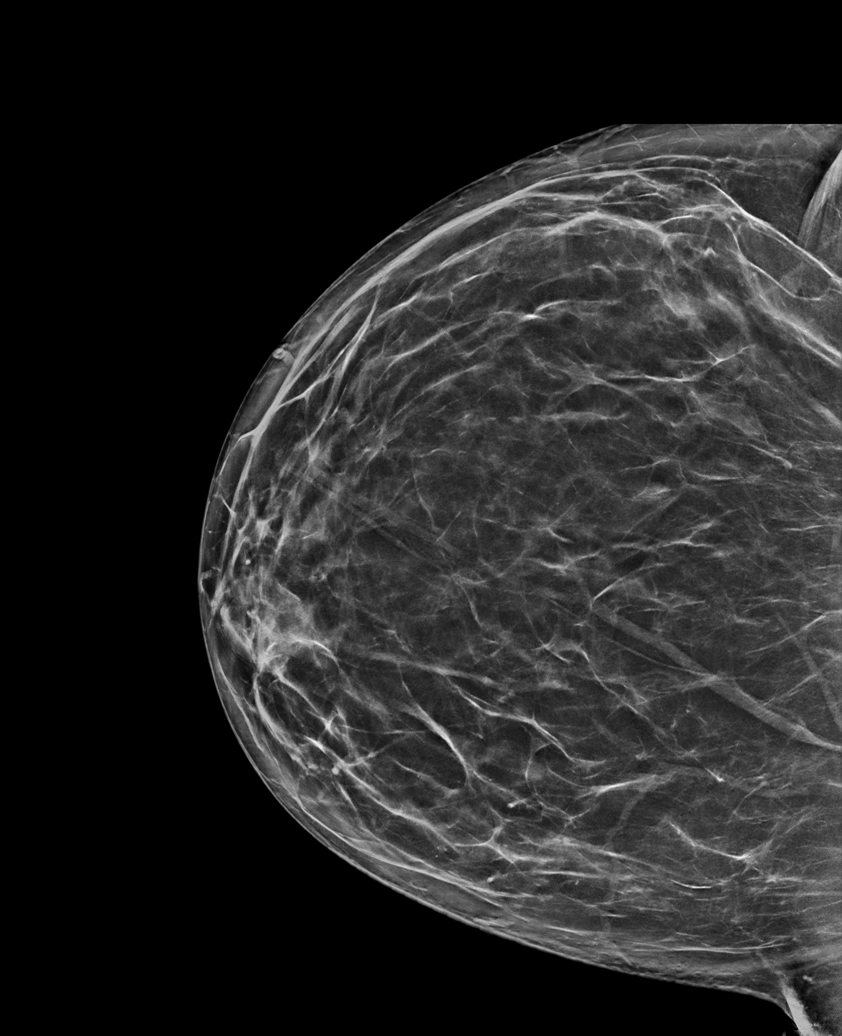

[R CC synth-2D (2 of 2)]
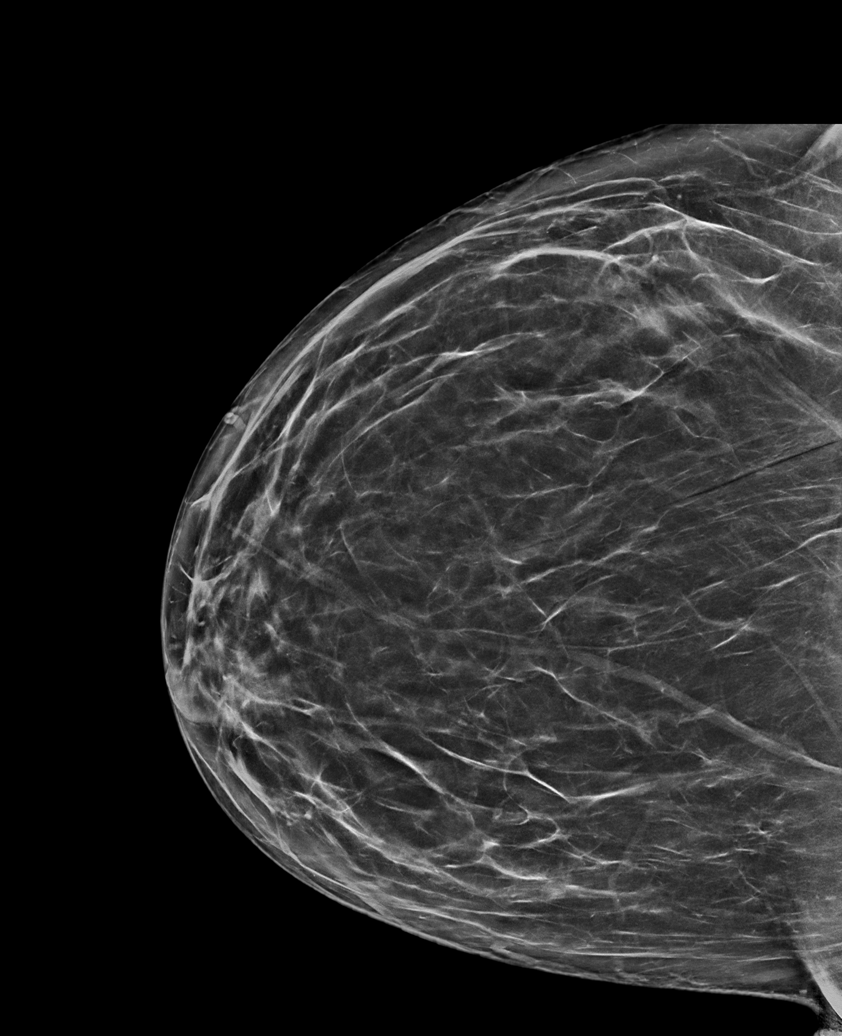

[L CC synth-2D]
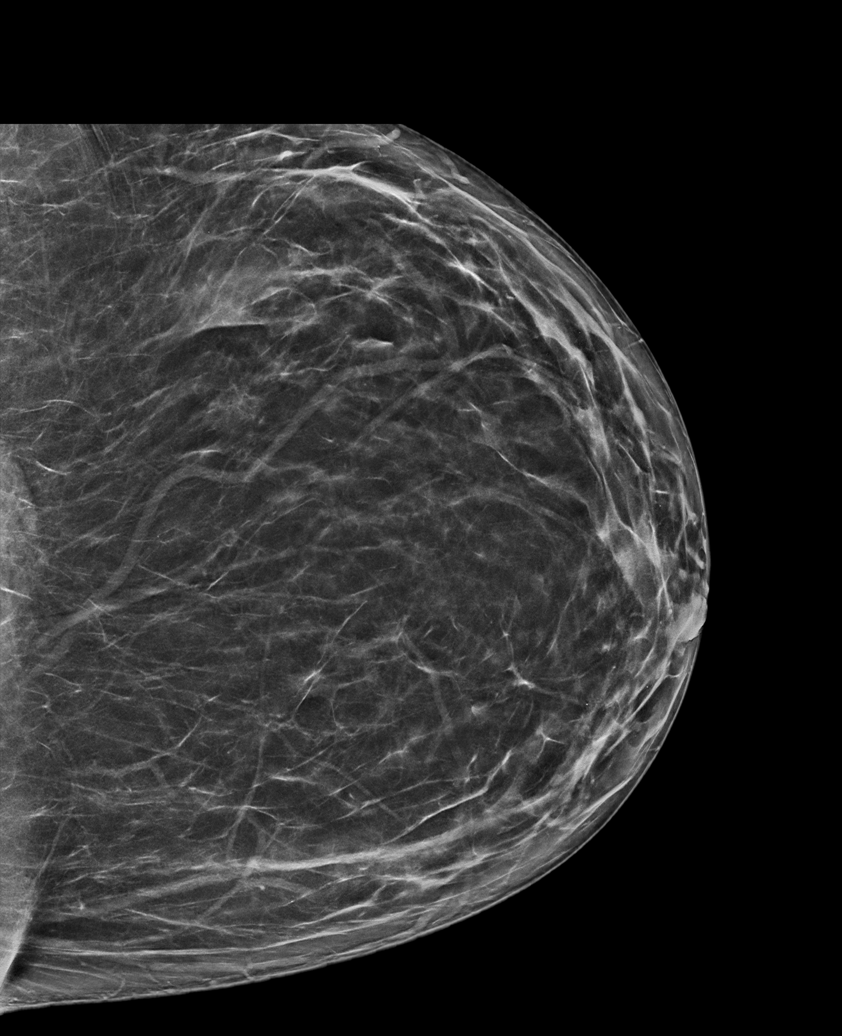

[R MLO synth-2D]
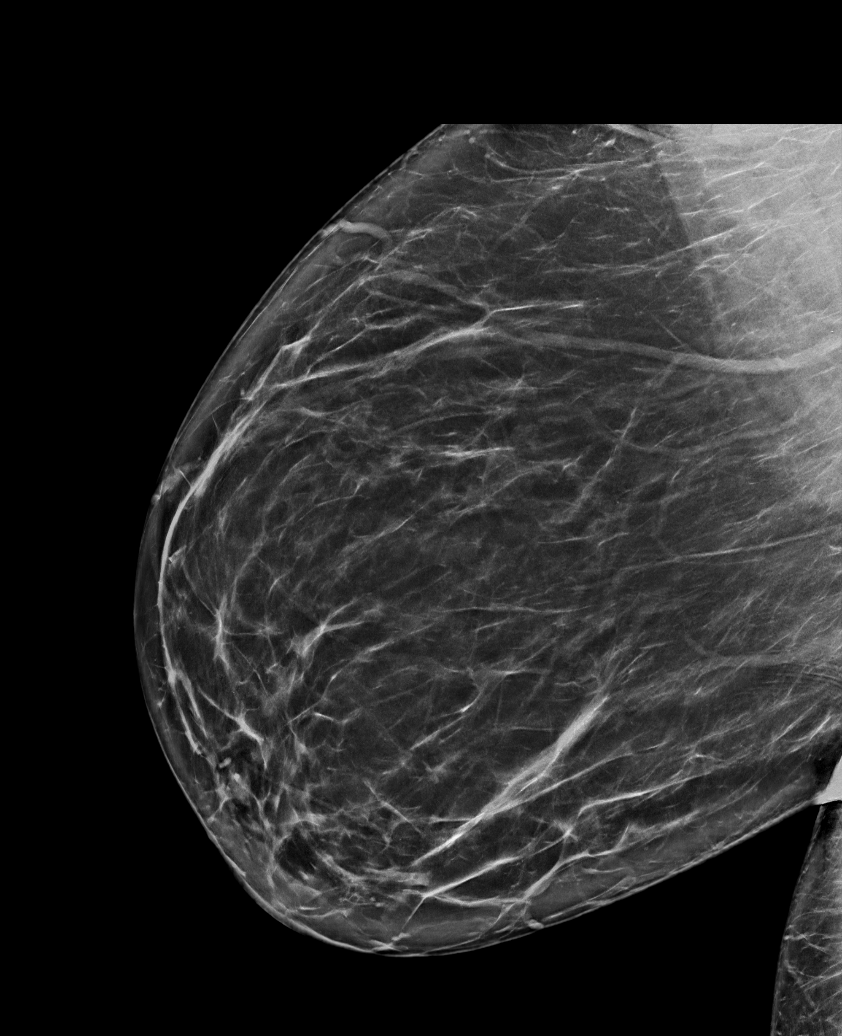

[R CC tomo · tomo slice 43/86.0]
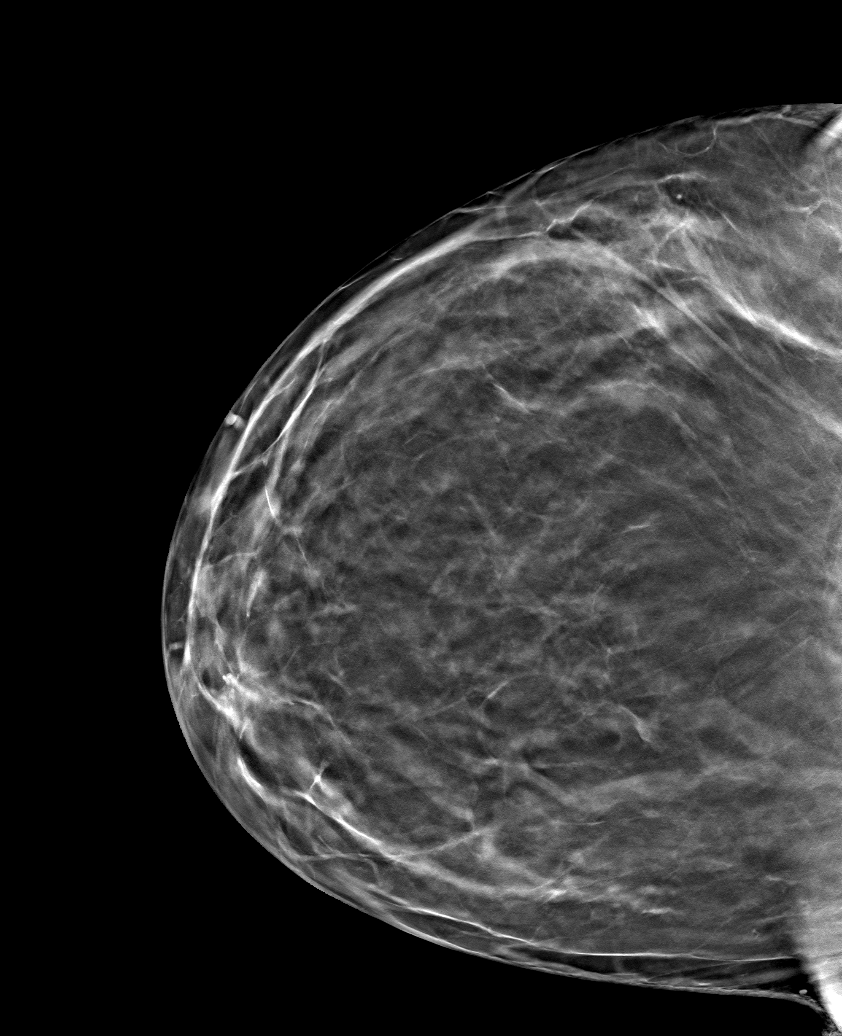

[6 of 30 positions shown; findings below may reference images not displayed]

ACR Breast Density Category b: There are scattered areas of
fibroglandular density.
FINDINGS: There are no findings suspicious for malignancy.
IMPRESSION: No mammographic evidence of malignancy. A result letter of this
screening mammogram will be mailed directly to the patient.

RECOMMENDATION:
Screening mammogram in one year. (Code:51-O-LD2)

BI-RADS CATEGORY  1: Negative.

## 2022-10-28 NOTE — Progress Notes (Unsigned)
PCP:  Myrlene Broker, MD   No chief complaint on file.    HPI:      [name] is a 47 y.o. G2P0011 who LMP was No LMP recorded., presents today for her annual examination.  Her menses are usually regular monthly, lasting 4-5 days, heavy flow, changing products every 30 min for 2 days. Dysmenorrhea mild. She does not have intermenstrual bleeding.  Hx of IDD last yr on CBC; anemia improved on 9/23 labs with PCP; taking Fe supp daily. Has vasomotor sx. Hx of leio. Discussed IUD last yr for sx and contraception.   Sex activity: single partner, contraception - none. No pain/bleeding. Tried OCPs in past but had increased facial hair so stopped them. Declines BC, doesn't want pills or depo. Hasn't done IUD.    Last Pap: 09/27/19 Results were normal/neg HPV DNA.  Hx of STDs: HPV/LGSIL on pap 2012/2013; HSV, takes valtrex prn. Needs Rx RF to mail order but unsure which one due to insurance change.  Last mammogram: 07/31/20 Results were normal, repeat in 12 months There is a FH of breast cancer in her mat grt aunt, genetic testing not indicated. There is no FH of ovarian cancer. The patient does not do self-breast exams.  Tobacco use: The patient denies current or previous tobacco use. Alcohol use: social drinker No drug use.  Exercise: moderately active  Colonoscopy: 10/22 with polyp at [facility], repeat due after 10 yrs  She does get adequate calcium and Vitamin D in her diet. Labs with PCP.  Normal BP with PCP recently.   Past Medical History:  Diagnosis Date   Cervical high risk human papillomavirus (HPV) DNA test positive 2012, 2013   Genital herpes    Irregular menses    LGSIL on Pap smear of cervix 2012, 2013   Obesity     Past Surgical History:  Procedure Laterality Date   COLPOSCOPY  2012   TONSILLECTOMY      Family History  Problem Relation Age of Onset   Diabetes Father    Breast cancer Maternal Aunt        not sure of age   Ovarian cancer Neg  Hx    Colon cancer Neg Hx    Colon polyps Neg Hx    Heart disease Neg Hx    Stomach cancer Neg Hx    Rectal cancer Neg Hx     Social History   Socioeconomic History   Marital status: Divorced    Spouse name: Not on file   Number of children: Not on file   Years of education: Not on file   Highest education level: Not on file  Occupational History   Not on file  Tobacco Use   Smoking status: Never   Smokeless tobacco: Never  Vaping Use   Vaping status: Never Used  Substance and Sexual Activity   Alcohol use: Yes    Comment: occasioanlly   Drug use: No   Sexual activity: Yes    Birth control/protection: None  Other Topics Concern   Not on file  Social History Narrative   Not on file   Social Determinants of Health   Financial Resource Strain: Not on file  Food Insecurity: Not on file  Transportation Needs: Not on file  Physical Activity: Not on file  Stress: Not on file  Social Connections: Not on file  Intimate Partner Violence: Not on file    Outpatient Medications Prior to Visit  Medication Sig Dispense  Refill   Ascorbic Acid (VITAMIN C) 1000 MG tablet Take 1,000 mg by mouth daily.     cholecalciferol (VITAMIN D3) 25 MCG (1000 UNIT) tablet Take 1,000 Units by mouth daily.     ferrous sulfate (SM IRON) 325 (65 FE) MG tablet Take 325 mg by mouth daily with breakfast.     ibuprofen (ADVIL) 800 MG tablet Take 1 tablet (800 mg total) by mouth every 8 (eight) hours as needed. 21 tablet 0   valACYclovir (VALTREX) 500 MG tablet Take 500 mg by mouth as needed.     No facility-administered medications prior to visit.    ROS:  Review of Systems  Constitutional:  Negative for fatigue, fever and unexpected weight change.  Respiratory:  Negative for cough, shortness of breath and wheezing.   Cardiovascular:  Negative for chest pain, palpitations and leg swelling.  Gastrointestinal:  Negative for blood in stool, constipation, diarrhea, nausea and vomiting.  Endocrine:  Negative for cold intolerance, heat intolerance and polyuria.  Genitourinary:  Negative for dyspareunia, dysuria, flank pain, frequency, genital sores, hematuria, menstrual problem, pelvic pain, urgency, vaginal bleeding, vaginal discharge and vaginal pain.  Musculoskeletal:  Negative for back pain, joint swelling and myalgias.  Skin:  Negative for rash.  Neurological:  Negative for dizziness, syncope, light-headedness, numbness and headaches.  Hematological:  Negative for adenopathy.  Psychiatric/Behavioral:  Negative for agitation, confusion, sleep disturbance and suicidal ideas. The patient is not nervous/anxious.   BREAST: No symptoms   Objective: There were no vitals taken for this visit. BP 152/98 initially  Physical Exam Constitutional:      Appearance: She is well-developed.  Genitourinary:     Vulva normal.     Right Labia: No rash, tenderness or lesions.    Left Labia: No tenderness, lesions or rash.    No vaginal discharge, erythema or tenderness.      Right Adnexa: not tender and no mass present.    Left Adnexa: not tender and no mass present.    No cervical friability or polyp.     Uterus is enlarged.     Uterus is not tender.  Breasts:    Right: No mass, nipple discharge, skin change or tenderness.     Left: No mass, nipple discharge, skin change or tenderness.  Neck:     Thyroid: No thyromegaly.  Cardiovascular:     Rate and Rhythm: Normal rate and regular rhythm.     Heart sounds: Normal heart sounds. No murmur heard. Pulmonary:     Effort: Pulmonary effort is normal.     Breath sounds: Normal breath sounds.  Abdominal:     Palpations: Abdomen is soft.     Tenderness: There is no abdominal tenderness. There is no guarding or rebound.  Musculoskeletal:        General: Normal range of motion.     Cervical back: Normal range of motion.  Lymphadenopathy:     Cervical: No cervical adenopathy.  Neurological:     General: No focal deficit present.     Mental  Status: She is alert and oriented to person, place, and time.     Cranial Nerves: No cranial nerve deficit.  Skin:    General: Skin is warm and dry.  Psychiatric:        Mood and Affect: Mood normal.        Behavior: Behavior normal.        Thought Content: Thought content normal.        Judgment: Judgment normal.  Vitals reviewed.    Assessment/Plan: Encounter for annual routine gynecological examination  Encounter for screening mammogram for malignant neoplasm of breast - Plan: MM 3D SCREEN BREAST BILATERAL; pt to schedule mammo  Menorrhagia with regular cycle--anemia improved with Fe Supp. Discussed aygestin/POPs, IUD, ablation, lysteda. Pt to f/u prn.   Herpes simplex vulvovaginitis--pt will call with new pharm info for Rx RF            GYN counsel breast self exam, mammography screening, adequate intake of calcium and vitamin D, diet and exercise     F/U  No follow-ups on file.  Vennessa Affinito B. Tabbitha Janvrin, PA-C 10/28/2022 7:01 PM

## 2022-10-29 ENCOUNTER — Ambulatory Visit (INDEPENDENT_AMBULATORY_CARE_PROVIDER_SITE_OTHER): Payer: Managed Care, Other (non HMO) | Admitting: Obstetrics and Gynecology

## 2022-10-29 ENCOUNTER — Other Ambulatory Visit (HOSPITAL_COMMUNITY)
Admission: RE | Admit: 2022-10-29 | Discharge: 2022-10-29 | Disposition: A | Discharge status: Home / Self Care | Payer: Managed Care, Other (non HMO) | Source: Ambulatory Visit | Attending: Obstetrics and Gynecology | Admitting: Obstetrics and Gynecology

## 2022-10-29 ENCOUNTER — Encounter: Payer: Self-pay | Admitting: Obstetrics and Gynecology

## 2022-10-29 VITALS — BP 126/88 | Ht 63.25 in | Wt 253.0 lb

## 2022-10-29 DIAGNOSIS — N920 Excessive and frequent menstruation with regular cycle: Secondary | ICD-10-CM

## 2022-10-29 DIAGNOSIS — A6004 Herpesviral vulvovaginitis: Secondary | ICD-10-CM

## 2022-10-29 DIAGNOSIS — N898 Other specified noninflammatory disorders of vagina: Secondary | ICD-10-CM

## 2022-10-29 DIAGNOSIS — Z124 Encounter for screening for malignant neoplasm of cervix: Secondary | ICD-10-CM | POA: Insufficient documentation

## 2022-10-29 DIAGNOSIS — Z0000 Encounter for general adult medical examination without abnormal findings: Secondary | ICD-10-CM

## 2022-10-29 DIAGNOSIS — Z1151 Encounter for screening for human papillomavirus (HPV): Secondary | ICD-10-CM

## 2022-10-29 DIAGNOSIS — D219 Benign neoplasm of connective and other soft tissue, unspecified: Secondary | ICD-10-CM

## 2022-10-29 DIAGNOSIS — Z131 Encounter for screening for diabetes mellitus: Secondary | ICD-10-CM

## 2022-10-29 DIAGNOSIS — Z01419 Encounter for gynecological examination (general) (routine) without abnormal findings: Secondary | ICD-10-CM

## 2022-10-29 DIAGNOSIS — Z13220 Encounter for screening for lipoid disorders: Secondary | ICD-10-CM

## 2022-10-29 DIAGNOSIS — Z1231 Encounter for screening mammogram for malignant neoplasm of breast: Secondary | ICD-10-CM

## 2022-10-29 LAB — POCT WET PREP WITH KOH
Clue Cells Wet Prep HPF POC: NEGATIVE
KOH Prep POC: NEGATIVE
Trichomonas, UA: NEGATIVE
Yeast Wet Prep HPF POC: NEGATIVE

## 2022-10-29 MED ORDER — VALACYCLOVIR HCL 500 MG PO TABS: 500.0000 mg | ORAL_TABLET | Freq: Two times a day (BID) | ORAL | 1 refills | Status: DC

## 2022-10-29 NOTE — Patient Instructions (Addendum)
I value your feedback and you entrusting us with your care. If you get a [facility] patient survey, I would appreciate you taking the time to let us know about your experience today. Thank you!  [facility] ([place])--[phone]  

## 2022-10-30 LAB — COMPREHENSIVE METABOLIC PANEL
ALT: 14 [IU]/L (ref 0–32)
AST: 17 [IU]/L (ref 0–40)
Albumin: 4 g/dL (ref 3.9–4.9)
Alkaline Phosphatase: 85 [IU]/L (ref 44–121)
BUN/Creatinine Ratio: 12 (ref 9–23)
BUN: 9 mg/dL (ref 6–24)
Bilirubin Total: 0.3 mg/dL (ref 0.0–1.2)
CO2: 21 mmol/L (ref 20–29)
Calcium: 9.5 mg/dL (ref 8.7–10.2)
Chloride: 102 mmol/L (ref 96–106)
Creatinine, Ser: 0.73 mg/dL (ref 0.57–1.00)
Globulin, Total: 3.6 g/dL (ref 1.5–4.5)
Glucose: 91 mg/dL (ref 70–99)
Potassium: 4.9 mmol/L (ref 3.5–5.2)
Sodium: 139 mmol/L (ref 134–144)
Total Protein: 7.6 g/dL (ref 6.0–8.5)
eGFR: 102 mL/min/{1.73_m2} (ref >59)

## 2022-10-30 LAB — LIPID PANEL
Chol/HDL Ratio: 3.3 {ratio} (ref 0.0–4.4)
Cholesterol, Total: 187 mg/dL (ref 100–199)
HDL: 57 mg/dL (ref >39)
LDL Chol Calc (NIH): 115 mg/dL — ABNORMAL HIGH (ref 0–99)
Triglycerides: 81 mg/dL (ref 0–149)
VLDL Cholesterol Cal: 15 mg/dL (ref 5–40)

## 2022-10-30 LAB — CBC WITH DIFFERENTIAL/PLATELET
Basophils Absolute: 0 10*3/uL (ref 0.0–0.2)
Basos: 1 %
EOS (ABSOLUTE): 0.1 10*3/uL (ref 0.0–0.4)
Eos: 2 %
Hematocrit: 33.8 % — ABNORMAL LOW (ref 34.0–46.6)
Hemoglobin: 9.9 g/dL — ABNORMAL LOW (ref 11.1–15.9)
Immature Grans (Abs): 0 10*3/uL (ref 0.0–0.1)
Immature Granulocytes: 0 %
Lymphocytes Absolute: 2.1 10*3/uL (ref 0.7–3.1)
Lymphs: 31 %
MCH: 23.9 pg — ABNORMAL LOW (ref 26.6–33.0)
MCHC: 29.3 g/dL — ABNORMAL LOW (ref 31.5–35.7)
MCV: 81 fL (ref 79–97)
Monocytes Absolute: 0.8 10*3/uL (ref 0.1–0.9)
Monocytes: 13 %
Neutrophils Absolute: 3.7 10*3/uL (ref 1.4–7.0)
Neutrophils: 53 %
Platelets: 495 10*3/uL — ABNORMAL HIGH (ref 150–450)
RBC: 4.15 x10E6/uL (ref 3.77–5.28)
RDW: 15.8 % — ABNORMAL HIGH (ref 11.7–15.4)
WBC: 6.7 10*3/uL (ref 3.4–10.8)

## 2022-10-30 LAB — IRON,TIBC AND FERRITIN PANEL
Ferritin: 8 ng/mL — ABNORMAL LOW (ref 15–150)
Iron Saturation: 7 % — CL (ref 15–55)
Iron: 31 ug/dL (ref 27–159)
Total Iron Binding Capacity: 465 ug/dL — ABNORMAL HIGH (ref 250–450)
UIBC: 434 ug/dL — ABNORMAL HIGH (ref 131–425)

## 2022-10-30 LAB — HEMOGLOBIN A1C
Est. average glucose Bld gHb Est-mCnc: 117 mg/dL
Hgb A1c MFr Bld: 5.7 % — ABNORMAL HIGH (ref 4.8–5.6)

## 2022-10-31 LAB — CYTOLOGY - PAP
Adequacy: ABSENT
Comment: NEGATIVE
Diagnosis: NEGATIVE
High risk HPV: NEGATIVE

## 2022-11-15 ENCOUNTER — Encounter: Payer: Self-pay | Admitting: Obstetrics and Gynecology

## 2022-11-15 DIAGNOSIS — N920 Excessive and frequent menstruation with regular cycle: Secondary | ICD-10-CM

## 2022-11-15 DIAGNOSIS — D219 Benign neoplasm of connective and other soft tissue, unspecified: Secondary | ICD-10-CM

## 2022-11-16 ENCOUNTER — Other Ambulatory Visit: Payer: Self-pay | Admitting: Obstetrics and Gynecology

## 2022-11-16 DIAGNOSIS — Z1231 Encounter for screening mammogram for malignant neoplasm of breast: Secondary | ICD-10-CM

## 2022-11-28 ENCOUNTER — Other Ambulatory Visit: Payer: Self-pay | Admitting: Obstetrics and Gynecology

## 2022-11-28 ENCOUNTER — Telehealth: Payer: Self-pay | Admitting: Obstetrics and Gynecology

## 2022-11-28 ENCOUNTER — Ambulatory Visit: Payer: Managed Care, Other (non HMO)

## 2022-11-28 DIAGNOSIS — D259 Leiomyoma of uterus, unspecified: Secondary | ICD-10-CM | POA: Diagnosis not present

## 2022-11-28 DIAGNOSIS — N920 Excessive and frequent menstruation with regular cycle: Secondary | ICD-10-CM

## 2022-11-28 DIAGNOSIS — D219 Benign neoplasm of connective and other soft tissue, unspecified: Secondary | ICD-10-CM

## 2022-11-28 NOTE — Telephone Encounter (Signed)
Pt aware of GYN u/s results. Has phone appt 12/03/22 with [facility]. Will have ref coordinator Vernon Prey make sure they have access to her u/s results.

## 2022-12-04 ENCOUNTER — Ambulatory Visit
Admission: RE | Admit: 2022-12-04 | Discharge: 2022-12-04 | Disposition: A | Discharge status: Home / Self Care | Payer: Managed Care, Other (non HMO) | Source: Ambulatory Visit

## 2022-12-04 DIAGNOSIS — Z1231 Encounter for screening mammogram for malignant neoplasm of breast: Secondary | ICD-10-CM

## 2022-12-15 HISTORY — PX: UTERINE FIBROID EMBOLIZATION: SHX825

## 2023-06-13 ENCOUNTER — Encounter: Payer: Self-pay | Admitting: Obstetrics and Gynecology

## 2023-06-16 NOTE — Progress Notes (Signed)
 Adelia Homestead, MD   Chief Complaint  Patient presents with   Vaginal Discharge    Discharge since Oct 2024. Abnormal odor, no itching or irritation since last Thursday.    HPI:      Carmen Sosa is a 48 y.o. G2P0011 whose LMP was Patient's last menstrual period was 05/16/2023 (exact date)., presents today for increased vaginal d/c, sometimes pinkish, with bad odor; no itching/irritation. Hx of BV in past but doesn't feel the same. No urin sx. No new soaps/detergents; no recent abx use but on predpak a couple wks ago. She is sexually active, no new partners. No pain/bleeding/dryness.   Hx of leio with IDA; S/p UFE 12/24; still having monthly menses, lasting 8 days but lighter since UFE. Still passing pink fleshy tissue a few times daily. [facility] said this was normal and could last 6 months. She should be getting f/u call soon from them. No recent GYN u/s.  Neg pap 10/24  Patient Active Problem List   Diagnosis Date Noted   Atypical chest pain 10/10/2021   Morbid obesity (HCC) 12/22/2020   Routine general medical examination at a health care facility 12/22/2020   Menorrhagia with regular cycle 09/27/2020   Herpes simplex vulvovaginitis 09/27/2019    Past Surgical History:  Procedure Laterality Date   COLPOSCOPY  2012   TONSILLECTOMY      Family History  Problem Relation Age of Onset   Hypertension Mother    Diabetes Father    Breast cancer Sister 2   Breast cancer Maternal Aunt        not sure of age   Ovarian cancer Neg Hx    Colon cancer Neg Hx    Colon polyps Neg Hx    Heart disease Neg Hx    Stomach cancer Neg Hx    Rectal cancer Neg Hx     Social History   Socioeconomic History   Marital status: Divorced    Spouse name: Not on file   Number of children: Not on file   Years of education: Not on file   Highest education level: Not on file  Occupational History   Not on file  Tobacco Use   Smoking status: Never   Smokeless  tobacco: Never  Vaping Use   Vaping status: Never Used  Substance and Sexual Activity   Alcohol use: Yes    Comment: occasionally   Drug use: No   Sexual activity: Yes    Birth control/protection: None  Other Topics Concern   Not on file  Social History Narrative   Not on file   Social Drivers of Health   Financial Resource Strain: Not on file  Food Insecurity: Not on file  Transportation Needs: Not on file  Physical Activity: Not on file  Stress: Not on file  Social Connections: Not on file  Intimate Partner Violence: Not on file    Outpatient Medications Prior to Visit  Medication Sig Dispense Refill   Ascorbic Acid (VITAMIN C) 1000 MG tablet Take 1,000 mg by mouth daily.     cholecalciferol (VITAMIN D3) 25 MCG (1000 UNIT) tablet Take 1,000 Units by mouth daily.     ferrous sulfate (SM IRON) 325 (65 FE) MG tablet Take 325 mg by mouth daily with breakfast.     ibuprofen  (ADVIL ) 800 MG tablet Take 1 tablet (800 mg total) by mouth every 8 (eight) hours as needed. 21 tablet 0   valACYclovir  (VALTREX ) 500 MG tablet Take  1 tablet (500 mg total) by mouth 2 (two) times daily. For 3 days prn sx 30 tablet 1   No facility-administered medications prior to visit.      ROS:  Review of Systems  Constitutional:  Negative for fever.  Gastrointestinal:  Negative for blood in stool, constipation, diarrhea, nausea and vomiting.  Genitourinary:  Positive for vaginal bleeding and vaginal discharge. Negative for dyspareunia, dysuria, flank pain, frequency, hematuria, urgency and vaginal pain.  Musculoskeletal:  Negative for back pain.  Skin:  Negative for rash.   BREAST: No symptoms   OBJECTIVE:   Vitals:  BP (!) 143/83   Pulse 77   Ht 5' 3.25" (1.607 m)   Wt 264 lb (119.7 kg)   LMP 05/16/2023 (Exact Date)   BMI 46.40 kg/m   Physical Exam Vitals reviewed.  Constitutional:      Appearance: She is well-developed.  Pulmonary:     Effort: Pulmonary effort is normal.   Genitourinary:    General: Normal vulva.     Pubic Area: No rash.      Labia:        Right: No rash, tenderness or lesion.        Left: No rash, tenderness or lesion.      Vagina: No vaginal discharge, erythema or tenderness.     Cervix: Normal. No lesion.     Uterus: Normal. Not enlarged and not tender.      Adnexa: Right adnexa normal and left adnexa normal.       Right: No mass or tenderness.         Left: No mass or tenderness.       Comments: YELLOWISH PINK VAG D/C ON EXAM; NO EVID OF TISSUE Musculoskeletal:        General: Normal range of motion.     Cervical back: Normal range of motion.  Skin:    General: Skin is warm and dry.  Neurological:     General: No focal deficit present.     Mental Status: She is alert and oriented to person, place, and time.  Psychiatric:        Mood and Affect: Mood normal.        Behavior: Behavior normal.        Thought Content: Thought content normal.        Judgment: Judgment normal.     Results: Results for orders placed or performed in visit on 06/17/23 (from the past 24 hours)  POCT Wet Prep with KOH     Status: Abnormal   Collection Time: 06/17/23  4:39 PM  Result Value Ref Range   Trichomonas, UA Negative    Clue Cells Wet Prep HPF POC pos    Epithelial Wet Prep HPF POC     Yeast Wet Prep HPF POC neg    Bacteria Wet Prep HPF POC     RBC Wet Prep HPF POC     WBC Wet Prep HPF POC     KOH Prep POC Positive (A) Negative     Assessment/Plan: BV (bacterial vaginosis) - Plan: clindamycin (CLEOCIN) 300 MG capsule, NuSwab Vaginitis Plus (VG+), POCT Wet Prep with KOH; pos sx and wet prep. Rule out STDs with swab. Rx clindamycin since pt doesn't like flagyl  oral pills/didn't do metrogel  due to bleeding. F/u prn.   Abnormal uterine bleeding (AUB) - Plan: US  PELVIS TRANSVAGINAL NON-OB (TV ONLY); s/p UFE, rule out STDs, chcek GYN u/s. Will f/u with results. Pt also to discuss with [facility] on their call. Still passing fleshy  tissue--asked pt to take picture and send via MyChart.   Leiomyoma - Plan: US  PELVIS TRANSVAGINAL NON-OB (TV ONLY); check status with leio  Screening for STD (sexually transmitted disease) - Plan: NuSwab Vaginitis Plus (VG+)    Meds ordered this encounter  Medications   clindamycin (CLEOCIN) 300 MG capsule    Sig: Take 1 capsule (300 mg total) by mouth 2 (two) times daily for 7 days.    Dispense:  14 capsule    Refill:  0    Supervising Provider:   Sofia Dunn [[id number]]      Return in about 2 days (around 06/19/2023) for GYN u/s for AUB/leio--[facility] to call pt.  Luman Holway B. Sweet Jarvis, PA-C 06/17/2023 4:40 PM

## 2023-06-17 ENCOUNTER — Encounter: Payer: Self-pay | Admitting: Obstetrics and Gynecology

## 2023-06-17 ENCOUNTER — Ambulatory Visit: Payer: Self-pay | Admitting: Obstetrics and Gynecology

## 2023-06-17 VITALS — BP 143/83 | HR 77 | Ht 63.25 in | Wt 264.0 lb

## 2023-06-17 DIAGNOSIS — N760 Acute vaginitis: Secondary | ICD-10-CM | POA: Diagnosis not present

## 2023-06-17 DIAGNOSIS — B9689 Other specified bacterial agents as the cause of diseases classified elsewhere: Secondary | ICD-10-CM | POA: Diagnosis not present

## 2023-06-17 DIAGNOSIS — D259 Leiomyoma of uterus, unspecified: Secondary | ICD-10-CM | POA: Diagnosis not present

## 2023-06-17 DIAGNOSIS — D219 Benign neoplasm of connective and other soft tissue, unspecified: Secondary | ICD-10-CM

## 2023-06-17 DIAGNOSIS — N898 Other specified noninflammatory disorders of vagina: Secondary | ICD-10-CM

## 2023-06-17 DIAGNOSIS — N939 Abnormal uterine and vaginal bleeding, unspecified: Secondary | ICD-10-CM

## 2023-06-17 DIAGNOSIS — Z113 Encounter for screening for infections with a predominantly sexual mode of transmission: Secondary | ICD-10-CM

## 2023-06-17 LAB — POCT WET PREP WITH KOH
Clue Cells Wet Prep HPF POC: POSITIVE
KOH Prep POC: POSITIVE — AB
Trichomonas, UA: NEGATIVE
Yeast Wet Prep HPF POC: NEGATIVE

## 2023-06-17 MED ORDER — CLINDAMYCIN HCL 300 MG PO CAPS: 300.0000 mg | ORAL_CAPSULE | Freq: Two times a day (BID) | ORAL | 0 refills | Status: AC

## 2023-06-18 ENCOUNTER — Encounter: Payer: Self-pay | Admitting: Obstetrics and Gynecology

## 2023-06-19 ENCOUNTER — Telehealth: Payer: Self-pay | Admitting: Obstetrics and Gynecology

## 2023-06-19 ENCOUNTER — Ambulatory Visit (INDEPENDENT_AMBULATORY_CARE_PROVIDER_SITE_OTHER)

## 2023-06-19 DIAGNOSIS — N939 Abnormal uterine and vaginal bleeding, unspecified: Secondary | ICD-10-CM | POA: Diagnosis not present

## 2023-06-19 DIAGNOSIS — D259 Leiomyoma of uterus, unspecified: Secondary | ICD-10-CM | POA: Diagnosis not present

## 2023-06-19 DIAGNOSIS — D219 Benign neoplasm of connective and other soft tissue, unspecified: Secondary | ICD-10-CM

## 2023-06-19 NOTE — Telephone Encounter (Signed)
 Pt aware of Gyn u/s results. Leio and uterine size smaller after UFE. Treated for BV 2 days ago. Pt to f/u after talking to [facility]. Reassurance given u/s results.

## 2023-06-21 LAB — NUSWAB VAGINITIS PLUS (VG+)
Candida albicans, NAA: NEGATIVE
Candida glabrata, NAA: NEGATIVE
Chlamydia trachomatis, NAA: NEGATIVE
Neisseria gonorrhoeae, NAA: NEGATIVE
Trich vag by NAA: NEGATIVE

## 2023-06-22 ENCOUNTER — Ambulatory Visit: Payer: Self-pay | Admitting: Obstetrics and Gynecology

## 2023-11-20 ENCOUNTER — Ambulatory Visit: Admitting: Obstetrics and Gynecology

## 2023-11-30 NOTE — Progress Notes (Unsigned)
 PCP:  Rollene Almarie LABOR, MD   No chief complaint on file.    HPI:      [name] is a 48 y.o. G2P0011 who LMP was No LMP recorded. (Menstrual status: Irregular Periods)., presents today for her annual examination.  Her menses are monthly, lasting 6 days, heavy flow, changing products every 30 min for 2 days. Dysmenorrhea mod, improved somewhat with NSAIDs. She does not have intermenstrual bleeding.  Hx of IDD last yr on CBC; anemia improved on 9/23 labs with PCP; taking Fe supp daily. Hx of leio on CT; no recent GYN u/s. Discussed IUD last yr for sx and contraception. Tried OCPs in past but had increased facial hair so stopped them. Declines BC, doesn't want pills or depo.  Sex activity: not currently sexually active;  contraception - none.   Has had increased white/clear vag d/c recently without itching/odor. No recent sexual activity.    Last Pap: 10/29/22 Results were normal/neg HPV DNA.  Hx of STDs: HPV/LGSIL on pap 2012/2013; HSV, takes valtrex  prn. Needs Rx RF to [facility]  Last mammogram: 12/04/22  Results were normal, repeat in 12 months There is a FH of breast cancer in her mat grt aunt and now sister (age 85), unsure if sister did genetic testing but pt doesn't currently qualify. There is no FH of ovarian cancer. The patient does self-breast exams.  Tobacco use: The patient denies current or previous tobacco use. Alcohol use: social drinker No drug use.  Exercise: min active  Colonoscopy: 10/22 with polyp at [facility], repeat due after 10 yrs  She does get adequate calcium and Vitamin D in her diet. Labs with PCP.   Past Medical History:  Diagnosis Date   Cervical high risk human papillomavirus (HPV) DNA test positive 2012, 2013   Genital herpes    Irregular menses    LGSIL on Pap smear of cervix 2012, 2013   Obesity     Past Surgical History:  Procedure Laterality Date   COLPOSCOPY  2012   TONSILLECTOMY      Family History  Problem  Relation Age of Onset   Hypertension Mother    Diabetes Father    Breast cancer Sister 64   Breast cancer Maternal Aunt        not sure of age   Ovarian cancer Neg Hx    Colon cancer Neg Hx    Colon polyps Neg Hx    Heart disease Neg Hx    Stomach cancer Neg Hx    Rectal cancer Neg Hx     Social History   Socioeconomic History   Marital status: Divorced    Spouse name: Not on file   Number of children: Not on file   Years of education: Not on file   Highest education level: Not on file  Occupational History   Not on file  Tobacco Use   Smoking status: Never   Smokeless tobacco: Never  Vaping Use   Vaping status: Never Used  Substance and Sexual Activity   Alcohol use: Yes    Comment: occasionally   Drug use: No   Sexual activity: Yes    Birth control/protection: None  Other Topics Concern   Not on file  Social History Narrative   Not on file   Social Drivers of Health   Financial Resource Strain: Not on file  Food Insecurity: Not on file  Transportation Needs: Not on file  Physical Activity: Not on file  Stress:  Not on file  Social Connections: Not on file  Intimate Partner Violence: Not on file    Outpatient Medications Prior to Visit  Medication Sig Dispense Refill   Ascorbic Acid (VITAMIN C) 1000 MG tablet Take 1,000 mg by mouth daily.     cholecalciferol (VITAMIN D3) 25 MCG (1000 UNIT) tablet Take 1,000 Units by mouth daily.     ferrous sulfate (SM IRON) 325 (65 FE) MG tablet Take 325 mg by mouth daily with breakfast.     ibuprofen  (ADVIL ) 800 MG tablet Take 1 tablet (800 mg total) by mouth every 8 (eight) hours as needed. 21 tablet 0   valACYclovir  (VALTREX ) 500 MG tablet Take 1 tablet (500 mg total) by mouth 2 (two) times daily. For 3 days prn sx 30 tablet 1   No facility-administered medications prior to visit.    ROS:  Review of Systems  Constitutional:  Negative for fatigue, fever and unexpected weight change.  Respiratory:  Negative for  cough, shortness of breath and wheezing.   Cardiovascular:  Negative for chest pain, palpitations and leg swelling.  Gastrointestinal:  Negative for blood in stool, constipation, diarrhea, nausea and vomiting.  Endocrine: Negative for cold intolerance, heat intolerance and polyuria.  Genitourinary:  Positive for vaginal discharge. Negative for dyspareunia, dysuria, flank pain, frequency, genital sores, hematuria, menstrual problem, pelvic pain, urgency, vaginal bleeding and vaginal pain.  Musculoskeletal:  Negative for back pain, joint swelling and myalgias.  Skin:  Negative for rash.  Neurological:  Negative for dizziness, syncope, light-headedness, numbness and headaches.  Hematological:  Negative for adenopathy.  Psychiatric/Behavioral:  Negative for agitation, confusion, sleep disturbance and suicidal ideas. The patient is not nervous/anxious.   BREAST: No symptoms   Objective: There were no vitals taken for this visit.   Physical Exam Constitutional:      Appearance: She is well-developed.  Genitourinary:     Vulva normal.     Right Labia: No rash, tenderness or lesions.    Left Labia: No tenderness, lesions or rash.    No vaginal discharge, erythema or tenderness.      Right Adnexa: not tender and no mass present.    Left Adnexa: not tender and no mass present.    No cervical friability or polyp.     Uterus is enlarged and irregular.     Uterus is not tender.     Uterine mass present. Breasts:    Right: No mass, nipple discharge, skin change or tenderness.     Left: No mass, nipple discharge, skin change or tenderness.  Neck:     Thyroid: No thyromegaly.  Cardiovascular:     Rate and Rhythm: Normal rate and regular rhythm.     Heart sounds: Normal heart sounds. No murmur heard. Pulmonary:     Effort: Pulmonary effort is normal.     Breath sounds: Normal breath sounds.  Abdominal:     Palpations: Abdomen is soft. There is mass.     Tenderness: There is no abdominal  tenderness. There is no guarding or rebound.      Comments: LEIO PALPABLE ON ABD EXAM  Musculoskeletal:        General: Normal range of motion.     Cervical back: Normal range of motion.  Lymphadenopathy:     Cervical: No cervical adenopathy.  Neurological:     General: No focal deficit present.     Mental Status: She is alert and oriented to person, place, and time.     Cranial Nerves:  No cranial nerve deficit.  Skin:    General: Skin is warm and dry.  Psychiatric:        Mood and Affect: Mood normal.        Behavior: Behavior normal.        Thought Content: Thought content normal.        Judgment: Judgment normal.  Vitals reviewed.    No results found for this or any previous visit (from the past 24 hours).    Assessment/Plan:  Encounter for annual routine gynecological examination  Cervical cancer screening - Plan: Cytology - PAP  Screening for HPV (human papillomavirus) - Plan: Cytology - PAP  Encounter for screening mammogram for malignant neoplasm of breast - Plan: MM 3D SCREENING MAMMOGRAM BILATERAL BREAST; pt to schedule mammo  Vaginal discharge - Plan: POCT Wet Prep with KOH; neg wet prep/exam. Reassurance. F/u prn.   Herpes simplex vulvovaginitis - Plan: valACYclovir  (VALTREX ) 500 MG tablet; Rx RF eRxd to [facility]  Leiomyoma - Plan: CBC with Differential/Platelet, Iron, TIBC and Ferritin Panel; chck labs. Discussed IUD, UFE, myfembree. Pt to consider and f/u if desires. If wants IUD/UFE, will check GYN u/s for updated size of leio.   Menorrhagia with regular cycle - Plan: CBC with Differential/Platelet, Iron, TIBC and Ferritin Panel; check labs. Taking Fe supp daily  Blood tests for routine general physical examination - Plan: Comprehensive metabolic panel, Lipid panel, Hemoglobin A1c  Screening cholesterol level - Plan: Lipid panel  Screening for diabetes mellitus - Plan: Hemoglobin A1c  No orders of the defined types were placed in this encounter.              GYN counsel breast self exam, mammography screening, adequate intake of calcium and vitamin D, diet and exercise     F/U  No follow-ups on file.  Zoraya Fiorenza B. Dalal Livengood, PA-C 11/30/2023 5:08 PM

## 2023-12-01 ENCOUNTER — Encounter: Payer: Self-pay | Admitting: Obstetrics and Gynecology

## 2023-12-01 ENCOUNTER — Other Ambulatory Visit (HOSPITAL_COMMUNITY)
Admission: RE | Admit: 2023-12-01 | Discharge: 2023-12-01 | Disposition: A | Discharge status: Home / Self Care | Source: Ambulatory Visit | Attending: Obstetrics and Gynecology | Admitting: Obstetrics and Gynecology

## 2023-12-01 ENCOUNTER — Ambulatory Visit (INDEPENDENT_AMBULATORY_CARE_PROVIDER_SITE_OTHER): Admitting: Obstetrics and Gynecology

## 2023-12-01 VITALS — BP 140/86 | HR 72 | Ht 63.5 in | Wt 261.0 lb

## 2023-12-01 DIAGNOSIS — N920 Excessive and frequent menstruation with regular cycle: Secondary | ICD-10-CM | POA: Diagnosis not present

## 2023-12-01 DIAGNOSIS — A6004 Herpesviral vulvovaginitis: Secondary | ICD-10-CM

## 2023-12-01 DIAGNOSIS — Z13220 Encounter for screening for lipoid disorders: Secondary | ICD-10-CM

## 2023-12-01 DIAGNOSIS — R87810 Cervical high risk human papillomavirus (HPV) DNA test positive: Secondary | ICD-10-CM | POA: Diagnosis present

## 2023-12-01 DIAGNOSIS — Z1231 Encounter for screening mammogram for malignant neoplasm of breast: Secondary | ICD-10-CM

## 2023-12-01 DIAGNOSIS — D259 Leiomyoma of uterus, unspecified: Secondary | ICD-10-CM

## 2023-12-01 DIAGNOSIS — Z124 Encounter for screening for malignant neoplasm of cervix: Secondary | ICD-10-CM | POA: Diagnosis present

## 2023-12-01 DIAGNOSIS — Z01411 Encounter for gynecological examination (general) (routine) with abnormal findings: Secondary | ICD-10-CM

## 2023-12-01 DIAGNOSIS — Z1151 Encounter for screening for human papillomavirus (HPV): Secondary | ICD-10-CM

## 2023-12-01 DIAGNOSIS — Z01419 Encounter for gynecological examination (general) (routine) without abnormal findings: Secondary | ICD-10-CM

## 2023-12-01 DIAGNOSIS — D219 Benign neoplasm of connective and other soft tissue, unspecified: Secondary | ICD-10-CM

## 2023-12-01 DIAGNOSIS — Z131 Encounter for screening for diabetes mellitus: Secondary | ICD-10-CM

## 2023-12-01 DIAGNOSIS — Z0000 Encounter for general adult medical examination without abnormal findings: Secondary | ICD-10-CM

## 2023-12-01 MED ORDER — VALACYCLOVIR HCL 500 MG PO TABS: 500.0000 mg | ORAL_TABLET | Freq: Two times a day (BID) | ORAL | 1 refills | Status: AC

## 2023-12-01 NOTE — Patient Instructions (Signed)
 I value your feedback and you entrusting [place] with your care. If you get a [facility] patient survey, I would appreciate you taking the time to let us know about your experience today. Thank you!  [facility] ([place])--[phone]

## 2023-12-02 ENCOUNTER — Other Ambulatory Visit

## 2023-12-02 DIAGNOSIS — Z0000 Encounter for general adult medical examination without abnormal findings: Secondary | ICD-10-CM

## 2023-12-02 DIAGNOSIS — Z13220 Encounter for screening for lipoid disorders: Secondary | ICD-10-CM

## 2023-12-02 DIAGNOSIS — Z131 Encounter for screening for diabetes mellitus: Secondary | ICD-10-CM

## 2023-12-03 ENCOUNTER — Ambulatory Visit: Payer: Self-pay | Admitting: Obstetrics and Gynecology

## 2023-12-03 LAB — CBC WITH DIFFERENTIAL/PLATELET
Basophils Absolute: 0 x10E3/uL (ref 0.0–0.2)
Basos: 0 %
EOS (ABSOLUTE): 0 x10E3/uL (ref 0.0–0.4)
Eos: 0 %
Hematocrit: 39.6 % (ref 34.0–46.6)
Hemoglobin: 12.4 g/dL (ref 11.1–15.9)
Immature Grans (Abs): 0 x10E3/uL (ref 0.0–0.1)
Immature Granulocytes: 0 %
Lymphocytes Absolute: 1.8 x10E3/uL (ref 0.7–3.1)
Lymphs: 27 %
MCH: 28.2 pg (ref 26.6–33.0)
MCHC: 31.3 g/dL — ABNORMAL LOW (ref 31.5–35.7)
MCV: 90 fL (ref 79–97)
Monocytes Absolute: 0.5 x10E3/uL (ref 0.1–0.9)
Monocytes: 8 %
Neutrophils Absolute: 4.3 x10E3/uL (ref 1.4–7.0)
Neutrophils: 65 %
Platelets: 403 x10E3/uL (ref 150–450)
RBC: 4.39 x10E6/uL (ref 3.77–5.28)
RDW: 14.4 % (ref 11.7–15.4)
WBC: 6.7 x10E3/uL (ref 3.4–10.8)

## 2023-12-03 LAB — COMPREHENSIVE METABOLIC PANEL WITH GFR
ALT: 15 IU/L (ref 0–32)
AST: 21 IU/L (ref 0–40)
Albumin: 4.1 g/dL (ref 3.9–4.9)
Alkaline Phosphatase: 90 IU/L (ref 41–116)
BUN/Creatinine Ratio: 11 (ref 9–23)
BUN: 9 mg/dL (ref 6–24)
Bilirubin Total: 0.3 mg/dL (ref 0.0–1.2)
CO2: 21 mmol/L (ref 20–29)
Calcium: 9.5 mg/dL (ref 8.7–10.2)
Chloride: 102 mmol/L (ref 96–106)
Creatinine, Ser: 0.85 mg/dL (ref 0.57–1.00)
Globulin, Total: 3.3 g/dL (ref 1.5–4.5)
Glucose: 93 mg/dL (ref 70–99)
Potassium: 4.8 mmol/L (ref 3.5–5.2)
Sodium: 140 mmol/L (ref 134–144)
Total Protein: 7.4 g/dL (ref 6.0–8.5)
eGFR: 84 mL/min/1.73 (ref >59)

## 2023-12-03 LAB — CYTOLOGY - PAP
Comment: NEGATIVE
Diagnosis: NEGATIVE
High risk HPV: NEGATIVE

## 2023-12-03 LAB — LIPID PANEL
Chol/HDL Ratio: 3.8 ratio (ref 0.0–4.4)
Cholesterol, Total: 214 mg/dL — ABNORMAL HIGH (ref 100–199)
HDL: 57 mg/dL (ref >39)
LDL Chol Calc (NIH): 141 mg/dL — ABNORMAL HIGH (ref 0–99)
Triglycerides: 89 mg/dL (ref 0–149)
VLDL Cholesterol Cal: 16 mg/dL (ref 5–40)

## 2023-12-03 LAB — HEMOGLOBIN A1C
Est. average glucose Bld gHb Est-mCnc: 120 mg/dL
Hgb A1c MFr Bld: 5.8 % — ABNORMAL HIGH (ref 4.8–5.6)

## 2023-12-26 ENCOUNTER — Ambulatory Visit
Admission: RE | Admit: 2023-12-26 | Discharge: 2023-12-26 | Disposition: A | Source: Ambulatory Visit | Attending: Obstetrics and Gynecology

## 2023-12-26 DIAGNOSIS — Z1231 Encounter for screening mammogram for malignant neoplasm of breast: Secondary | ICD-10-CM

## 2024-01-02 ENCOUNTER — Other Ambulatory Visit: Payer: Self-pay | Admitting: Obstetrics and Gynecology

## 2024-01-02 DIAGNOSIS — R928 Other abnormal and inconclusive findings on diagnostic imaging of breast: Secondary | ICD-10-CM

## 2024-01-20 ENCOUNTER — Ambulatory Visit
Admission: RE | Admit: 2024-01-20 | Discharge: 2024-01-20 | Disposition: A | Source: Ambulatory Visit | Attending: Obstetrics and Gynecology

## 2024-01-20 DIAGNOSIS — R928 Other abnormal and inconclusive findings on diagnostic imaging of breast: Secondary | ICD-10-CM

## 2024-01-21 ENCOUNTER — Ambulatory Visit: Payer: Self-pay | Admitting: Obstetrics and Gynecology
